# Patient Record
Sex: Female | Born: 1945 | Race: White | Hispanic: No | Marital: Married | Smoking: Never smoker
Health system: Southern US, Community
[De-identification: ages and names within clinical notes are randomized; demographics above are authoritative.]

## PROBLEM LIST (undated history)

## (undated) DIAGNOSIS — I509 Heart failure, unspecified: Secondary | ICD-10-CM

## (undated) DIAGNOSIS — E039 Hypothyroidism, unspecified: Secondary | ICD-10-CM

## (undated) DIAGNOSIS — I10 Essential (primary) hypertension: Secondary | ICD-10-CM

## (undated) DIAGNOSIS — M549 Dorsalgia, unspecified: Secondary | ICD-10-CM

---

## 2014-04-11 DIAGNOSIS — H44113 Panuveitis, bilateral: Secondary | ICD-10-CM | POA: Diagnosis not present

## 2014-05-03 DIAGNOSIS — H44112 Panuveitis, left eye: Secondary | ICD-10-CM | POA: Diagnosis not present

## 2014-05-24 DIAGNOSIS — Z1231 Encounter for screening mammogram for malignant neoplasm of breast: Secondary | ICD-10-CM | POA: Diagnosis not present

## 2014-06-04 DIAGNOSIS — H44113 Panuveitis, bilateral: Secondary | ICD-10-CM | POA: Diagnosis not present

## 2014-07-04 DIAGNOSIS — E782 Mixed hyperlipidemia: Secondary | ICD-10-CM | POA: Diagnosis not present

## 2014-07-04 DIAGNOSIS — I10 Essential (primary) hypertension: Secondary | ICD-10-CM | POA: Diagnosis not present

## 2014-07-04 DIAGNOSIS — E039 Hypothyroidism, unspecified: Secondary | ICD-10-CM | POA: Diagnosis not present

## 2014-07-04 DIAGNOSIS — E1165 Type 2 diabetes mellitus with hyperglycemia: Secondary | ICD-10-CM | POA: Diagnosis not present

## 2014-07-04 DIAGNOSIS — Z23 Encounter for immunization: Secondary | ICD-10-CM | POA: Diagnosis not present

## 2014-07-04 DIAGNOSIS — Z79899 Other long term (current) drug therapy: Secondary | ICD-10-CM | POA: Diagnosis not present

## 2014-07-04 DIAGNOSIS — D509 Iron deficiency anemia, unspecified: Secondary | ICD-10-CM | POA: Diagnosis not present

## 2014-07-15 DIAGNOSIS — D510 Vitamin B12 deficiency anemia due to intrinsic factor deficiency: Secondary | ICD-10-CM | POA: Diagnosis not present

## 2014-08-09 DIAGNOSIS — H35353 Cystoid macular degeneration, bilateral: Secondary | ICD-10-CM | POA: Diagnosis not present

## 2014-08-09 DIAGNOSIS — E119 Type 2 diabetes mellitus without complications: Secondary | ICD-10-CM | POA: Diagnosis not present

## 2014-08-09 DIAGNOSIS — H30893 Other chorioretinal inflammations, bilateral: Secondary | ICD-10-CM | POA: Diagnosis not present

## 2014-08-16 DIAGNOSIS — D510 Vitamin B12 deficiency anemia due to intrinsic factor deficiency: Secondary | ICD-10-CM | POA: Diagnosis not present

## 2014-09-04 DIAGNOSIS — H30893 Other chorioretinal inflammations, bilateral: Secondary | ICD-10-CM | POA: Diagnosis not present

## 2014-10-02 DIAGNOSIS — E119 Type 2 diabetes mellitus without complications: Secondary | ICD-10-CM | POA: Diagnosis not present

## 2014-10-02 DIAGNOSIS — H30893 Other chorioretinal inflammations, bilateral: Secondary | ICD-10-CM | POA: Diagnosis not present

## 2014-10-04 DIAGNOSIS — H44113 Panuveitis, bilateral: Secondary | ICD-10-CM | POA: Diagnosis not present

## 2014-10-04 DIAGNOSIS — H30893 Other chorioretinal inflammations, bilateral: Secondary | ICD-10-CM | POA: Diagnosis not present

## 2014-10-09 DIAGNOSIS — E538 Deficiency of other specified B group vitamins: Secondary | ICD-10-CM | POA: Diagnosis not present

## 2014-10-22 DIAGNOSIS — H44113 Panuveitis, bilateral: Secondary | ICD-10-CM | POA: Diagnosis not present

## 2014-10-22 DIAGNOSIS — E119 Type 2 diabetes mellitus without complications: Secondary | ICD-10-CM | POA: Diagnosis not present

## 2014-10-22 DIAGNOSIS — H30893 Other chorioretinal inflammations, bilateral: Secondary | ICD-10-CM | POA: Diagnosis not present

## 2014-11-12 DIAGNOSIS — F339 Major depressive disorder, recurrent, unspecified: Secondary | ICD-10-CM | POA: Diagnosis not present

## 2014-11-12 DIAGNOSIS — E1165 Type 2 diabetes mellitus with hyperglycemia: Secondary | ICD-10-CM | POA: Diagnosis not present

## 2014-11-12 DIAGNOSIS — E782 Mixed hyperlipidemia: Secondary | ICD-10-CM | POA: Diagnosis not present

## 2014-11-12 DIAGNOSIS — Z0000 Encounter for general adult medical examination without abnormal findings: Secondary | ICD-10-CM | POA: Diagnosis not present

## 2014-11-12 DIAGNOSIS — M8580 Other specified disorders of bone density and structure, unspecified site: Secondary | ICD-10-CM | POA: Diagnosis not present

## 2014-11-12 DIAGNOSIS — E039 Hypothyroidism, unspecified: Secondary | ICD-10-CM | POA: Diagnosis not present

## 2014-11-12 DIAGNOSIS — Z79899 Other long term (current) drug therapy: Secondary | ICD-10-CM | POA: Diagnosis not present

## 2014-11-12 DIAGNOSIS — D510 Vitamin B12 deficiency anemia due to intrinsic factor deficiency: Secondary | ICD-10-CM | POA: Diagnosis not present

## 2015-03-11 DIAGNOSIS — H44113 Panuveitis, bilateral: Secondary | ICD-10-CM | POA: Diagnosis not present

## 2015-03-11 DIAGNOSIS — H30893 Other chorioretinal inflammations, bilateral: Secondary | ICD-10-CM | POA: Diagnosis not present

## 2015-03-11 DIAGNOSIS — H35371 Puckering of macula, right eye: Secondary | ICD-10-CM | POA: Diagnosis not present

## 2015-04-08 DIAGNOSIS — E118 Type 2 diabetes mellitus with unspecified complications: Secondary | ICD-10-CM | POA: Diagnosis not present

## 2015-04-08 DIAGNOSIS — H30033 Focal chorioretinal inflammation, peripheral, bilateral: Secondary | ICD-10-CM | POA: Diagnosis not present

## 2015-04-08 DIAGNOSIS — H35373 Puckering of macula, bilateral: Secondary | ICD-10-CM | POA: Diagnosis not present

## 2015-04-08 DIAGNOSIS — Z961 Presence of intraocular lens: Secondary | ICD-10-CM | POA: Diagnosis not present

## 2015-04-09 DIAGNOSIS — H30033 Focal chorioretinal inflammation, peripheral, bilateral: Secondary | ICD-10-CM | POA: Diagnosis not present

## 2015-04-09 DIAGNOSIS — H30039 Focal chorioretinal inflammation, peripheral, unspecified eye: Secondary | ICD-10-CM | POA: Diagnosis not present

## 2015-04-11 DIAGNOSIS — H30033 Focal chorioretinal inflammation, peripheral, bilateral: Secondary | ICD-10-CM | POA: Diagnosis not present

## 2015-04-29 DIAGNOSIS — Z961 Presence of intraocular lens: Secondary | ICD-10-CM | POA: Diagnosis not present

## 2015-04-29 DIAGNOSIS — H35373 Puckering of macula, bilateral: Secondary | ICD-10-CM | POA: Diagnosis not present

## 2015-04-29 DIAGNOSIS — H30033 Focal chorioretinal inflammation, peripheral, bilateral: Secondary | ICD-10-CM | POA: Diagnosis not present

## 2015-04-29 DIAGNOSIS — E118 Type 2 diabetes mellitus with unspecified complications: Secondary | ICD-10-CM | POA: Diagnosis not present

## 2015-06-05 DIAGNOSIS — Z1389 Encounter for screening for other disorder: Secondary | ICD-10-CM | POA: Diagnosis not present

## 2015-06-05 DIAGNOSIS — E782 Mixed hyperlipidemia: Secondary | ICD-10-CM | POA: Diagnosis not present

## 2015-06-05 DIAGNOSIS — E1165 Type 2 diabetes mellitus with hyperglycemia: Secondary | ICD-10-CM | POA: Diagnosis not present

## 2015-06-05 DIAGNOSIS — E039 Hypothyroidism, unspecified: Secondary | ICD-10-CM | POA: Diagnosis not present

## 2015-06-05 DIAGNOSIS — N183 Chronic kidney disease, stage 3 (moderate): Secondary | ICD-10-CM | POA: Diagnosis not present

## 2015-06-17 DIAGNOSIS — H30033 Focal chorioretinal inflammation, peripheral, bilateral: Secondary | ICD-10-CM | POA: Diagnosis not present

## 2015-06-17 DIAGNOSIS — E118 Type 2 diabetes mellitus with unspecified complications: Secondary | ICD-10-CM | POA: Diagnosis not present

## 2015-06-17 DIAGNOSIS — H35373 Puckering of macula, bilateral: Secondary | ICD-10-CM | POA: Diagnosis not present

## 2015-06-17 DIAGNOSIS — Z961 Presence of intraocular lens: Secondary | ICD-10-CM | POA: Diagnosis not present

## 2015-09-09 DIAGNOSIS — Z961 Presence of intraocular lens: Secondary | ICD-10-CM | POA: Diagnosis not present

## 2015-09-09 DIAGNOSIS — E118 Type 2 diabetes mellitus with unspecified complications: Secondary | ICD-10-CM | POA: Diagnosis not present

## 2015-09-09 DIAGNOSIS — H35373 Puckering of macula, bilateral: Secondary | ICD-10-CM | POA: Diagnosis not present

## 2015-09-09 DIAGNOSIS — H44113 Panuveitis, bilateral: Secondary | ICD-10-CM | POA: Diagnosis not present

## 2015-09-09 DIAGNOSIS — H30033 Focal chorioretinal inflammation, peripheral, bilateral: Secondary | ICD-10-CM | POA: Diagnosis not present

## 2015-10-01 DIAGNOSIS — Z7901 Long term (current) use of anticoagulants: Secondary | ICD-10-CM | POA: Diagnosis not present

## 2015-10-21 DIAGNOSIS — Z961 Presence of intraocular lens: Secondary | ICD-10-CM | POA: Diagnosis not present

## 2015-10-21 DIAGNOSIS — H35373 Puckering of macula, bilateral: Secondary | ICD-10-CM | POA: Diagnosis not present

## 2015-10-21 DIAGNOSIS — H44113 Panuveitis, bilateral: Secondary | ICD-10-CM | POA: Diagnosis not present

## 2015-10-21 DIAGNOSIS — E118 Type 2 diabetes mellitus with unspecified complications: Secondary | ICD-10-CM | POA: Diagnosis not present

## 2015-10-21 DIAGNOSIS — H30033 Focal chorioretinal inflammation, peripheral, bilateral: Secondary | ICD-10-CM | POA: Diagnosis not present

## 2015-12-23 DIAGNOSIS — H44113 Panuveitis, bilateral: Secondary | ICD-10-CM | POA: Diagnosis not present

## 2015-12-23 DIAGNOSIS — Z961 Presence of intraocular lens: Secondary | ICD-10-CM | POA: Diagnosis not present

## 2015-12-23 DIAGNOSIS — Z79899 Other long term (current) drug therapy: Secondary | ICD-10-CM | POA: Diagnosis not present

## 2015-12-23 DIAGNOSIS — E118 Type 2 diabetes mellitus with unspecified complications: Secondary | ICD-10-CM | POA: Diagnosis not present

## 2015-12-23 DIAGNOSIS — H35373 Puckering of macula, bilateral: Secondary | ICD-10-CM | POA: Diagnosis not present

## 2015-12-23 DIAGNOSIS — H30033 Focal chorioretinal inflammation, peripheral, bilateral: Secondary | ICD-10-CM | POA: Diagnosis not present

## 2016-01-15 DIAGNOSIS — Z0000 Encounter for general adult medical examination without abnormal findings: Secondary | ICD-10-CM | POA: Diagnosis not present

## 2016-01-15 DIAGNOSIS — D510 Vitamin B12 deficiency anemia due to intrinsic factor deficiency: Secondary | ICD-10-CM | POA: Diagnosis not present

## 2016-01-15 DIAGNOSIS — N183 Chronic kidney disease, stage 3 (moderate): Secondary | ICD-10-CM | POA: Diagnosis not present

## 2016-01-15 DIAGNOSIS — L0100 Impetigo, unspecified: Secondary | ICD-10-CM | POA: Diagnosis not present

## 2016-01-15 DIAGNOSIS — B372 Candidiasis of skin and nail: Secondary | ICD-10-CM | POA: Diagnosis not present

## 2016-02-01 DIAGNOSIS — K760 Fatty (change of) liver, not elsewhere classified: Secondary | ICD-10-CM | POA: Diagnosis not present

## 2016-02-01 DIAGNOSIS — R945 Abnormal results of liver function studies: Secondary | ICD-10-CM | POA: Diagnosis not present

## 2016-02-12 DIAGNOSIS — J029 Acute pharyngitis, unspecified: Secondary | ICD-10-CM | POA: Diagnosis not present

## 2016-02-12 DIAGNOSIS — L0100 Impetigo, unspecified: Secondary | ICD-10-CM | POA: Diagnosis not present

## 2016-02-18 DIAGNOSIS — D649 Anemia, unspecified: Secondary | ICD-10-CM | POA: Diagnosis not present

## 2016-02-18 DIAGNOSIS — E860 Dehydration: Secondary | ICD-10-CM | POA: Diagnosis not present

## 2016-02-18 DIAGNOSIS — D539 Nutritional anemia, unspecified: Secondary | ICD-10-CM | POA: Diagnosis not present

## 2016-02-18 DIAGNOSIS — K121 Other forms of stomatitis: Secondary | ICD-10-CM | POA: Diagnosis not present

## 2016-02-18 DIAGNOSIS — R7989 Other specified abnormal findings of blood chemistry: Secondary | ICD-10-CM | POA: Diagnosis not present

## 2016-02-18 DIAGNOSIS — K1230 Oral mucositis (ulcerative), unspecified: Secondary | ICD-10-CM | POA: Diagnosis not present

## 2016-02-18 DIAGNOSIS — M7989 Other specified soft tissue disorders: Secondary | ICD-10-CM | POA: Diagnosis not present

## 2016-02-18 DIAGNOSIS — R531 Weakness: Secondary | ICD-10-CM | POA: Diagnosis not present

## 2016-02-18 DIAGNOSIS — M542 Cervicalgia: Secondary | ICD-10-CM | POA: Diagnosis not present

## 2016-02-18 DIAGNOSIS — K760 Fatty (change of) liver, not elsewhere classified: Secondary | ICD-10-CM | POA: Diagnosis not present

## 2016-02-18 DIAGNOSIS — E785 Hyperlipidemia, unspecified: Secondary | ICD-10-CM | POA: Diagnosis not present

## 2016-02-18 DIAGNOSIS — E119 Type 2 diabetes mellitus without complications: Secondary | ICD-10-CM | POA: Diagnosis not present

## 2016-02-18 DIAGNOSIS — R0603 Acute respiratory distress: Secondary | ICD-10-CM | POA: Diagnosis not present

## 2016-02-18 DIAGNOSIS — E669 Obesity, unspecified: Secondary | ICD-10-CM | POA: Diagnosis not present

## 2016-02-18 DIAGNOSIS — F418 Other specified anxiety disorders: Secondary | ICD-10-CM | POA: Diagnosis not present

## 2016-02-18 DIAGNOSIS — D709 Neutropenia, unspecified: Secondary | ICD-10-CM | POA: Diagnosis not present

## 2016-02-18 DIAGNOSIS — K1232 Oral mucositis (ulcerative) due to other drugs: Secondary | ICD-10-CM | POA: Diagnosis not present

## 2016-02-18 DIAGNOSIS — D61811 Other drug-induced pancytopenia: Secondary | ICD-10-CM | POA: Diagnosis not present

## 2016-02-18 DIAGNOSIS — I10 Essential (primary) hypertension: Secondary | ICD-10-CM | POA: Diagnosis not present

## 2016-02-18 DIAGNOSIS — E039 Hypothyroidism, unspecified: Secondary | ICD-10-CM | POA: Diagnosis not present

## 2016-02-18 DIAGNOSIS — R945 Abnormal results of liver function studies: Secondary | ICD-10-CM | POA: Diagnosis not present

## 2016-02-21 DIAGNOSIS — R7989 Other specified abnormal findings of blood chemistry: Secondary | ICD-10-CM | POA: Diagnosis not present

## 2016-02-21 DIAGNOSIS — D649 Anemia, unspecified: Secondary | ICD-10-CM | POA: Diagnosis not present

## 2016-02-21 DIAGNOSIS — R945 Abnormal results of liver function studies: Secondary | ICD-10-CM | POA: Diagnosis not present

## 2016-02-21 DIAGNOSIS — K1230 Oral mucositis (ulcerative), unspecified: Secondary | ICD-10-CM

## 2016-02-21 DIAGNOSIS — D709 Neutropenia, unspecified: Secondary | ICD-10-CM | POA: Diagnosis not present

## 2016-02-24 DIAGNOSIS — D61818 Other pancytopenia: Secondary | ICD-10-CM

## 2016-02-24 DIAGNOSIS — E876 Hypokalemia: Secondary | ICD-10-CM

## 2016-02-25 DIAGNOSIS — R197 Diarrhea, unspecified: Secondary | ICD-10-CM

## 2016-02-25 DIAGNOSIS — D61818 Other pancytopenia: Secondary | ICD-10-CM

## 2016-02-25 DIAGNOSIS — R7989 Other specified abnormal findings of blood chemistry: Secondary | ICD-10-CM

## 2016-02-25 DIAGNOSIS — E785 Hyperlipidemia, unspecified: Secondary | ICD-10-CM | POA: Diagnosis not present

## 2016-02-25 DIAGNOSIS — E860 Dehydration: Secondary | ICD-10-CM

## 2016-02-25 DIAGNOSIS — E119 Type 2 diabetes mellitus without complications: Secondary | ICD-10-CM

## 2016-02-25 DIAGNOSIS — D61811 Other drug-induced pancytopenia: Secondary | ICD-10-CM | POA: Diagnosis not present

## 2016-02-25 DIAGNOSIS — F418 Other specified anxiety disorders: Secondary | ICD-10-CM | POA: Diagnosis not present

## 2016-02-25 DIAGNOSIS — K121 Other forms of stomatitis: Secondary | ICD-10-CM

## 2016-02-25 DIAGNOSIS — I10 Essential (primary) hypertension: Secondary | ICD-10-CM | POA: Diagnosis not present

## 2016-03-10 DIAGNOSIS — D649 Anemia, unspecified: Secondary | ICD-10-CM | POA: Diagnosis not present

## 2016-03-10 DIAGNOSIS — D72819 Decreased white blood cell count, unspecified: Secondary | ICD-10-CM | POA: Diagnosis not present

## 2016-03-10 DIAGNOSIS — D803 Selective deficiency of immunoglobulin G [IgG] subclasses: Secondary | ICD-10-CM | POA: Diagnosis not present

## 2016-03-16 DIAGNOSIS — M549 Dorsalgia, unspecified: Secondary | ICD-10-CM | POA: Diagnosis not present

## 2016-03-16 DIAGNOSIS — S32039A Unspecified fracture of third lumbar vertebra, initial encounter for closed fracture: Secondary | ICD-10-CM | POA: Diagnosis not present

## 2016-03-16 DIAGNOSIS — S32030A Wedge compression fracture of third lumbar vertebra, initial encounter for closed fracture: Secondary | ICD-10-CM | POA: Diagnosis not present

## 2016-03-17 DIAGNOSIS — R748 Abnormal levels of other serum enzymes: Secondary | ICD-10-CM | POA: Diagnosis not present

## 2016-03-17 DIAGNOSIS — K121 Other forms of stomatitis: Secondary | ICD-10-CM | POA: Diagnosis not present

## 2016-03-17 DIAGNOSIS — E1165 Type 2 diabetes mellitus with hyperglycemia: Secondary | ICD-10-CM | POA: Diagnosis not present

## 2016-03-17 DIAGNOSIS — N179 Acute kidney failure, unspecified: Secondary | ICD-10-CM | POA: Diagnosis not present

## 2016-03-17 DIAGNOSIS — D649 Anemia, unspecified: Secondary | ICD-10-CM | POA: Diagnosis not present

## 2016-03-17 DIAGNOSIS — S32030A Wedge compression fracture of third lumbar vertebra, initial encounter for closed fracture: Secondary | ICD-10-CM | POA: Diagnosis not present

## 2016-03-17 DIAGNOSIS — D709 Neutropenia, unspecified: Secondary | ICD-10-CM | POA: Diagnosis not present

## 2016-03-25 DIAGNOSIS — M47896 Other spondylosis, lumbar region: Secondary | ICD-10-CM | POA: Diagnosis not present

## 2016-03-25 DIAGNOSIS — M545 Low back pain: Secondary | ICD-10-CM | POA: Diagnosis not present

## 2016-03-25 DIAGNOSIS — M5136 Other intervertebral disc degeneration, lumbar region: Secondary | ICD-10-CM | POA: Diagnosis not present

## 2016-03-25 DIAGNOSIS — S32030A Wedge compression fracture of third lumbar vertebra, initial encounter for closed fracture: Secondary | ICD-10-CM | POA: Diagnosis not present

## 2016-03-25 DIAGNOSIS — W19XXXA Unspecified fall, initial encounter: Secondary | ICD-10-CM | POA: Diagnosis not present

## 2016-03-30 DIAGNOSIS — Z6839 Body mass index (BMI) 39.0-39.9, adult: Secondary | ICD-10-CM | POA: Diagnosis not present

## 2016-03-30 DIAGNOSIS — S32030A Wedge compression fracture of third lumbar vertebra, initial encounter for closed fracture: Secondary | ICD-10-CM | POA: Diagnosis not present

## 2016-03-30 DIAGNOSIS — M549 Dorsalgia, unspecified: Secondary | ICD-10-CM | POA: Diagnosis not present

## 2016-03-30 DIAGNOSIS — M47816 Spondylosis without myelopathy or radiculopathy, lumbar region: Secondary | ICD-10-CM | POA: Diagnosis not present

## 2016-04-05 DIAGNOSIS — S32030A Wedge compression fracture of third lumbar vertebra, initial encounter for closed fracture: Secondary | ICD-10-CM | POA: Diagnosis not present

## 2016-04-13 DIAGNOSIS — M549 Dorsalgia, unspecified: Secondary | ICD-10-CM | POA: Diagnosis not present

## 2016-04-27 DIAGNOSIS — Z79899 Other long term (current) drug therapy: Secondary | ICD-10-CM | POA: Diagnosis not present

## 2016-04-27 DIAGNOSIS — M5416 Radiculopathy, lumbar region: Secondary | ICD-10-CM | POA: Diagnosis not present

## 2016-04-27 DIAGNOSIS — I10 Essential (primary) hypertension: Secondary | ICD-10-CM | POA: Diagnosis not present

## 2016-04-27 DIAGNOSIS — E039 Hypothyroidism, unspecified: Secondary | ICD-10-CM | POA: Diagnosis not present

## 2016-04-27 DIAGNOSIS — F339 Major depressive disorder, recurrent, unspecified: Secondary | ICD-10-CM | POA: Diagnosis not present

## 2016-04-27 DIAGNOSIS — E782 Mixed hyperlipidemia: Secondary | ICD-10-CM | POA: Diagnosis not present

## 2016-04-27 DIAGNOSIS — E1165 Type 2 diabetes mellitus with hyperglycemia: Secondary | ICD-10-CM | POA: Diagnosis not present

## 2016-05-04 DIAGNOSIS — M5126 Other intervertebral disc displacement, lumbar region: Secondary | ICD-10-CM | POA: Diagnosis not present

## 2016-05-18 DIAGNOSIS — M5126 Other intervertebral disc displacement, lumbar region: Secondary | ICD-10-CM | POA: Diagnosis not present

## 2016-06-18 DIAGNOSIS — M5116 Intervertebral disc disorders with radiculopathy, lumbar region: Secondary | ICD-10-CM | POA: Diagnosis not present

## 2016-06-18 DIAGNOSIS — S32030S Wedge compression fracture of third lumbar vertebra, sequela: Secondary | ICD-10-CM | POA: Diagnosis not present

## 2016-06-18 DIAGNOSIS — M9953 Intervertebral disc stenosis of neural canal of lumbar region: Secondary | ICD-10-CM | POA: Diagnosis not present

## 2016-06-18 DIAGNOSIS — M4726 Other spondylosis with radiculopathy, lumbar region: Secondary | ICD-10-CM | POA: Diagnosis not present

## 2016-06-18 DIAGNOSIS — M545 Low back pain: Secondary | ICD-10-CM | POA: Diagnosis not present

## 2016-06-21 DIAGNOSIS — M7989 Other specified soft tissue disorders: Secondary | ICD-10-CM | POA: Diagnosis not present

## 2016-06-21 DIAGNOSIS — L03116 Cellulitis of left lower limb: Secondary | ICD-10-CM | POA: Diagnosis not present

## 2016-06-21 DIAGNOSIS — I129 Hypertensive chronic kidney disease with stage 1 through stage 4 chronic kidney disease, or unspecified chronic kidney disease: Secondary | ICD-10-CM | POA: Diagnosis not present

## 2016-06-21 DIAGNOSIS — E039 Hypothyroidism, unspecified: Secondary | ICD-10-CM | POA: Diagnosis not present

## 2016-06-21 DIAGNOSIS — N289 Disorder of kidney and ureter, unspecified: Secondary | ICD-10-CM | POA: Diagnosis not present

## 2016-06-21 DIAGNOSIS — R238 Other skin changes: Secondary | ICD-10-CM | POA: Diagnosis not present

## 2016-06-21 DIAGNOSIS — S99911A Unspecified injury of right ankle, initial encounter: Secondary | ICD-10-CM | POA: Diagnosis not present

## 2016-06-21 DIAGNOSIS — E1122 Type 2 diabetes mellitus with diabetic chronic kidney disease: Secondary | ICD-10-CM | POA: Diagnosis not present

## 2016-06-21 DIAGNOSIS — R778 Other specified abnormalities of plasma proteins: Secondary | ICD-10-CM | POA: Diagnosis not present

## 2016-06-21 DIAGNOSIS — N183 Chronic kidney disease, stage 3 (moderate): Secondary | ICD-10-CM | POA: Diagnosis not present

## 2016-06-21 DIAGNOSIS — M79662 Pain in left lower leg: Secondary | ICD-10-CM | POA: Diagnosis not present

## 2016-06-21 DIAGNOSIS — S8992XA Unspecified injury of left lower leg, initial encounter: Secondary | ICD-10-CM | POA: Diagnosis not present

## 2016-06-21 DIAGNOSIS — R404 Transient alteration of awareness: Secondary | ICD-10-CM | POA: Diagnosis not present

## 2016-06-21 DIAGNOSIS — F419 Anxiety disorder, unspecified: Secondary | ICD-10-CM | POA: Diagnosis not present

## 2016-06-21 DIAGNOSIS — S299XXA Unspecified injury of thorax, initial encounter: Secondary | ICD-10-CM | POA: Diagnosis not present

## 2016-06-21 DIAGNOSIS — S8991XA Unspecified injury of right lower leg, initial encounter: Secondary | ICD-10-CM | POA: Diagnosis not present

## 2016-06-21 DIAGNOSIS — M79661 Pain in right lower leg: Secondary | ICD-10-CM | POA: Diagnosis not present

## 2016-06-21 DIAGNOSIS — R531 Weakness: Secondary | ICD-10-CM | POA: Diagnosis not present

## 2016-06-21 DIAGNOSIS — S99912A Unspecified injury of left ankle, initial encounter: Secondary | ICD-10-CM | POA: Diagnosis not present

## 2016-06-22 DIAGNOSIS — R531 Weakness: Secondary | ICD-10-CM | POA: Diagnosis not present

## 2016-06-22 DIAGNOSIS — M352 Behcet's disease: Secondary | ICD-10-CM | POA: Diagnosis not present

## 2016-06-22 DIAGNOSIS — L0390 Cellulitis, unspecified: Secondary | ICD-10-CM | POA: Diagnosis not present

## 2016-06-22 DIAGNOSIS — E119 Type 2 diabetes mellitus without complications: Secondary | ICD-10-CM | POA: Diagnosis not present

## 2016-06-22 DIAGNOSIS — N289 Disorder of kidney and ureter, unspecified: Secondary | ICD-10-CM | POA: Diagnosis not present

## 2016-06-22 DIAGNOSIS — R238 Other skin changes: Secondary | ICD-10-CM | POA: Diagnosis not present

## 2016-06-22 DIAGNOSIS — N1330 Unspecified hydronephrosis: Secondary | ICD-10-CM | POA: Diagnosis not present

## 2016-06-22 DIAGNOSIS — I10 Essential (primary) hypertension: Secondary | ICD-10-CM | POA: Diagnosis not present

## 2016-06-22 DIAGNOSIS — F418 Other specified anxiety disorders: Secondary | ICD-10-CM | POA: Diagnosis not present

## 2016-06-23 DIAGNOSIS — I10 Essential (primary) hypertension: Secondary | ICD-10-CM | POA: Diagnosis not present

## 2016-06-23 DIAGNOSIS — E119 Type 2 diabetes mellitus without complications: Secondary | ICD-10-CM | POA: Diagnosis not present

## 2016-06-23 DIAGNOSIS — R238 Other skin changes: Secondary | ICD-10-CM | POA: Diagnosis not present

## 2016-06-23 DIAGNOSIS — F419 Anxiety disorder, unspecified: Secondary | ICD-10-CM | POA: Diagnosis not present

## 2016-06-23 DIAGNOSIS — M1990 Unspecified osteoarthritis, unspecified site: Secondary | ICD-10-CM | POA: Diagnosis not present

## 2016-06-23 DIAGNOSIS — E039 Hypothyroidism, unspecified: Secondary | ICD-10-CM | POA: Diagnosis not present

## 2016-06-23 DIAGNOSIS — R531 Weakness: Secondary | ICD-10-CM | POA: Diagnosis not present

## 2016-06-24 DIAGNOSIS — E119 Type 2 diabetes mellitus without complications: Secondary | ICD-10-CM | POA: Diagnosis not present

## 2016-06-24 DIAGNOSIS — E1122 Type 2 diabetes mellitus with diabetic chronic kidney disease: Secondary | ICD-10-CM | POA: Diagnosis not present

## 2016-06-24 DIAGNOSIS — M7989 Other specified soft tissue disorders: Secondary | ICD-10-CM | POA: Diagnosis not present

## 2016-06-24 DIAGNOSIS — R238 Other skin changes: Secondary | ICD-10-CM | POA: Diagnosis not present

## 2016-06-24 DIAGNOSIS — E039 Hypothyroidism, unspecified: Secondary | ICD-10-CM | POA: Diagnosis not present

## 2016-06-24 DIAGNOSIS — R531 Weakness: Secondary | ICD-10-CM | POA: Diagnosis not present

## 2016-06-24 DIAGNOSIS — X58XXXS Exposure to other specified factors, sequela: Secondary | ICD-10-CM | POA: Diagnosis not present

## 2016-06-24 DIAGNOSIS — R609 Edema, unspecified: Secondary | ICD-10-CM | POA: Diagnosis not present

## 2016-06-24 DIAGNOSIS — R296 Repeated falls: Secondary | ICD-10-CM | POA: Diagnosis not present

## 2016-06-24 DIAGNOSIS — I10 Essential (primary) hypertension: Secondary | ICD-10-CM | POA: Diagnosis not present

## 2016-06-24 DIAGNOSIS — S32030S Wedge compression fracture of third lumbar vertebra, sequela: Secondary | ICD-10-CM | POA: Diagnosis not present

## 2016-06-24 DIAGNOSIS — I129 Hypertensive chronic kidney disease with stage 1 through stage 4 chronic kidney disease, or unspecified chronic kidney disease: Secondary | ICD-10-CM | POA: Diagnosis not present

## 2016-06-24 DIAGNOSIS — M1990 Unspecified osteoarthritis, unspecified site: Secondary | ICD-10-CM | POA: Diagnosis not present

## 2016-06-24 DIAGNOSIS — M170 Bilateral primary osteoarthritis of knee: Secondary | ICD-10-CM | POA: Diagnosis not present

## 2016-06-24 DIAGNOSIS — R41841 Cognitive communication deficit: Secondary | ICD-10-CM | POA: Diagnosis not present

## 2016-06-24 DIAGNOSIS — M48061 Spinal stenosis, lumbar region without neurogenic claudication: Secondary | ICD-10-CM | POA: Diagnosis not present

## 2016-06-24 DIAGNOSIS — N183 Chronic kidney disease, stage 3 (moderate): Secondary | ICD-10-CM | POA: Diagnosis not present

## 2016-06-24 DIAGNOSIS — M545 Low back pain: Secondary | ICD-10-CM | POA: Diagnosis not present

## 2016-06-24 DIAGNOSIS — F419 Anxiety disorder, unspecified: Secondary | ICD-10-CM | POA: Diagnosis not present

## 2016-06-24 DIAGNOSIS — G8929 Other chronic pain: Secondary | ICD-10-CM | POA: Diagnosis not present

## 2016-07-02 DIAGNOSIS — X58XXXS Exposure to other specified factors, sequela: Secondary | ICD-10-CM | POA: Diagnosis not present

## 2016-07-02 DIAGNOSIS — M545 Low back pain: Secondary | ICD-10-CM | POA: Diagnosis not present

## 2016-07-02 DIAGNOSIS — S32030S Wedge compression fracture of third lumbar vertebra, sequela: Secondary | ICD-10-CM | POA: Diagnosis not present

## 2016-07-02 DIAGNOSIS — M48061 Spinal stenosis, lumbar region without neurogenic claudication: Secondary | ICD-10-CM | POA: Diagnosis not present

## 2016-07-05 ENCOUNTER — Encounter: Payer: Self-pay | Admitting: *Deleted

## 2016-07-05 ENCOUNTER — Other Ambulatory Visit: Payer: Self-pay | Admitting: *Deleted

## 2016-07-05 NOTE — Patient Outreach (Signed)
Spoke with Alden Server, SW at facility.  She reports patient discharged this weekend.  Patient had appealed the [facility] decision but lost appeal.  Lucy set up home care through [facility]. Feels patient could benefit from [facility] care management services.  Plan to reach out to patient and or family re: [facility] care management services.  Alben Spittle. Albertha Ghee, RN, BSN, CCM  Post Acute Chartered loss adjuster [phone]) Business Cell  [phone]) Toll Free Office

## 2016-07-05 NOTE — Patient Outreach (Signed)
Call to patient, verified HIPAA.  Patient reports that she is home from skilled, states she had 3 falls in one day and that sent her to hospital and to skilled. She had "spinal surgery" in the past and had f/u MRI on Friday.  Patient denies any heart failure or COPD, she does have diabetes which she manages with diet and oral medications.  Patient reports she has left knee and leg pain, uses hydrocodone and lyrica for management.  She has a wound on her left knee and some blisters.  Patient states she is married, her spouse assists her with meals and with transportation. She states spouse has already started supper for tonight. Patient reports she takes about 10 medications, only one is expensive, lyrica, she is unsure if she will continue this medication or not.  Patient verifies she has home care services.  Patient has appointment with Dr. Sherral Hammers, her PCP this Friday, spouse will transport.  RNCM educated patient to let MD assess her left leg/knee regarding pain, blisters and scabbing.   RNCM reviewed [facility] program services.  RNCM reviewed [facility] pharmacy program RNCM reviewed [employer] meals program for post hospital/Skilled discharge.  Patient would appreciate some information on [facility] program, did not want to pursue [employer] meals.   Plan to mail outreach letter with brochure and magnet. Will sign off at this time.   Alben Spittle. Albertha Ghee, RN, BSN, CCM  Post Acute Chartered loss adjuster [phone]) Business Cell  [phone]) Toll Free Office

## 2016-07-06 DIAGNOSIS — E1122 Type 2 diabetes mellitus with diabetic chronic kidney disease: Secondary | ICD-10-CM | POA: Diagnosis not present

## 2016-07-06 DIAGNOSIS — N183 Chronic kidney disease, stage 3 (moderate): Secondary | ICD-10-CM | POA: Diagnosis not present

## 2016-07-06 DIAGNOSIS — G8929 Other chronic pain: Secondary | ICD-10-CM | POA: Diagnosis not present

## 2016-07-06 DIAGNOSIS — I129 Hypertensive chronic kidney disease with stage 1 through stage 4 chronic kidney disease, or unspecified chronic kidney disease: Secondary | ICD-10-CM | POA: Diagnosis not present

## 2016-07-06 DIAGNOSIS — R238 Other skin changes: Secondary | ICD-10-CM | POA: Diagnosis not present

## 2016-07-06 DIAGNOSIS — M1991 Primary osteoarthritis, unspecified site: Secondary | ICD-10-CM | POA: Diagnosis not present

## 2016-07-07 DIAGNOSIS — N183 Chronic kidney disease, stage 3 (moderate): Secondary | ICD-10-CM | POA: Diagnosis not present

## 2016-07-07 DIAGNOSIS — R238 Other skin changes: Secondary | ICD-10-CM | POA: Diagnosis not present

## 2016-07-07 DIAGNOSIS — I129 Hypertensive chronic kidney disease with stage 1 through stage 4 chronic kidney disease, or unspecified chronic kidney disease: Secondary | ICD-10-CM | POA: Diagnosis not present

## 2016-07-07 DIAGNOSIS — E1122 Type 2 diabetes mellitus with diabetic chronic kidney disease: Secondary | ICD-10-CM | POA: Diagnosis not present

## 2016-07-07 DIAGNOSIS — G8929 Other chronic pain: Secondary | ICD-10-CM | POA: Diagnosis not present

## 2016-07-07 DIAGNOSIS — M1991 Primary osteoarthritis, unspecified site: Secondary | ICD-10-CM | POA: Diagnosis not present

## 2016-07-08 DIAGNOSIS — R238 Other skin changes: Secondary | ICD-10-CM | POA: Diagnosis not present

## 2016-07-08 DIAGNOSIS — M4807 Spinal stenosis, lumbosacral region: Secondary | ICD-10-CM | POA: Diagnosis not present

## 2016-07-08 DIAGNOSIS — Z1389 Encounter for screening for other disorder: Secondary | ICD-10-CM | POA: Diagnosis not present

## 2016-07-08 DIAGNOSIS — R262 Difficulty in walking, not elsewhere classified: Secondary | ICD-10-CM | POA: Diagnosis not present

## 2016-07-09 DIAGNOSIS — M4726 Other spondylosis with radiculopathy, lumbar region: Secondary | ICD-10-CM | POA: Diagnosis not present

## 2016-07-09 DIAGNOSIS — M5116 Intervertebral disc disorders with radiculopathy, lumbar region: Secondary | ICD-10-CM | POA: Diagnosis not present

## 2016-07-09 DIAGNOSIS — S32030S Wedge compression fracture of third lumbar vertebra, sequela: Secondary | ICD-10-CM | POA: Diagnosis not present

## 2016-07-09 DIAGNOSIS — M8008XA Age-related osteoporosis with current pathological fracture, vertebra(e), initial encounter for fracture: Secondary | ICD-10-CM | POA: Diagnosis not present

## 2016-07-12 DIAGNOSIS — M1991 Primary osteoarthritis, unspecified site: Secondary | ICD-10-CM | POA: Diagnosis not present

## 2016-07-12 DIAGNOSIS — N183 Chronic kidney disease, stage 3 (moderate): Secondary | ICD-10-CM | POA: Diagnosis not present

## 2016-07-12 DIAGNOSIS — R238 Other skin changes: Secondary | ICD-10-CM | POA: Diagnosis not present

## 2016-07-12 DIAGNOSIS — G8929 Other chronic pain: Secondary | ICD-10-CM | POA: Diagnosis not present

## 2016-07-12 DIAGNOSIS — I129 Hypertensive chronic kidney disease with stage 1 through stage 4 chronic kidney disease, or unspecified chronic kidney disease: Secondary | ICD-10-CM | POA: Diagnosis not present

## 2016-07-12 DIAGNOSIS — E1122 Type 2 diabetes mellitus with diabetic chronic kidney disease: Secondary | ICD-10-CM | POA: Diagnosis not present

## 2016-07-13 DIAGNOSIS — M1991 Primary osteoarthritis, unspecified site: Secondary | ICD-10-CM | POA: Diagnosis not present

## 2016-07-13 DIAGNOSIS — I129 Hypertensive chronic kidney disease with stage 1 through stage 4 chronic kidney disease, or unspecified chronic kidney disease: Secondary | ICD-10-CM | POA: Diagnosis not present

## 2016-07-13 DIAGNOSIS — N183 Chronic kidney disease, stage 3 (moderate): Secondary | ICD-10-CM | POA: Diagnosis not present

## 2016-07-13 DIAGNOSIS — R238 Other skin changes: Secondary | ICD-10-CM | POA: Diagnosis not present

## 2016-07-13 DIAGNOSIS — E1122 Type 2 diabetes mellitus with diabetic chronic kidney disease: Secondary | ICD-10-CM | POA: Diagnosis not present

## 2016-07-13 DIAGNOSIS — G8929 Other chronic pain: Secondary | ICD-10-CM | POA: Diagnosis not present

## 2016-07-14 ENCOUNTER — Inpatient Hospital Stay (HOSPITAL_COMMUNITY)
Admission: EM | Admit: 2016-07-14 | Discharge: 2016-07-16 | DRG: 872 | Disposition: A | Discharge status: Home / Self Care | Payer: Medicare HMO | Attending: Internal Medicine | Admitting: Internal Medicine

## 2016-07-14 ENCOUNTER — Emergency Department (HOSPITAL_COMMUNITY): Payer: Medicare HMO

## 2016-07-14 ENCOUNTER — Encounter (HOSPITAL_COMMUNITY): Payer: Self-pay | Admitting: Emergency Medicine

## 2016-07-14 DIAGNOSIS — M79604 Pain in right leg: Secondary | ICD-10-CM

## 2016-07-14 DIAGNOSIS — D696 Thrombocytopenia, unspecified: Secondary | ICD-10-CM | POA: Diagnosis not present

## 2016-07-14 DIAGNOSIS — S79912A Unspecified injury of left hip, initial encounter: Secondary | ICD-10-CM | POA: Diagnosis not present

## 2016-07-14 DIAGNOSIS — E785 Hyperlipidemia, unspecified: Secondary | ICD-10-CM | POA: Diagnosis present

## 2016-07-14 DIAGNOSIS — E039 Hypothyroidism, unspecified: Secondary | ICD-10-CM | POA: Diagnosis present

## 2016-07-14 DIAGNOSIS — E1122 Type 2 diabetes mellitus with diabetic chronic kidney disease: Secondary | ICD-10-CM | POA: Diagnosis not present

## 2016-07-14 DIAGNOSIS — M79605 Pain in left leg: Secondary | ICD-10-CM | POA: Diagnosis not present

## 2016-07-14 DIAGNOSIS — R4589 Other symptoms and signs involving emotional state: Secondary | ICD-10-CM | POA: Diagnosis present

## 2016-07-14 DIAGNOSIS — Z66 Do not resuscitate: Secondary | ICD-10-CM | POA: Diagnosis present

## 2016-07-14 DIAGNOSIS — L0390 Cellulitis, unspecified: Secondary | ICD-10-CM | POA: Diagnosis not present

## 2016-07-14 DIAGNOSIS — N183 Chronic kidney disease, stage 3 unspecified: Secondary | ICD-10-CM | POA: Diagnosis present

## 2016-07-14 DIAGNOSIS — I129 Hypertensive chronic kidney disease with stage 1 through stage 4 chronic kidney disease, or unspecified chronic kidney disease: Secondary | ICD-10-CM | POA: Diagnosis not present

## 2016-07-14 DIAGNOSIS — Y92009 Unspecified place in unspecified non-institutional (private) residence as the place of occurrence of the external cause: Secondary | ICD-10-CM | POA: Diagnosis not present

## 2016-07-14 DIAGNOSIS — A419 Sepsis, unspecified organism: Principal | ICD-10-CM | POA: Diagnosis present

## 2016-07-14 DIAGNOSIS — L03115 Cellulitis of right lower limb: Secondary | ICD-10-CM | POA: Diagnosis present

## 2016-07-14 DIAGNOSIS — E1140 Type 2 diabetes mellitus with diabetic neuropathy, unspecified: Secondary | ICD-10-CM | POA: Diagnosis not present

## 2016-07-14 DIAGNOSIS — F329 Major depressive disorder, single episode, unspecified: Secondary | ICD-10-CM | POA: Diagnosis present

## 2016-07-14 DIAGNOSIS — R296 Repeated falls: Secondary | ICD-10-CM | POA: Diagnosis present

## 2016-07-14 DIAGNOSIS — D61818 Other pancytopenia: Secondary | ICD-10-CM | POA: Diagnosis not present

## 2016-07-14 DIAGNOSIS — D649 Anemia, unspecified: Secondary | ICD-10-CM | POA: Diagnosis not present

## 2016-07-14 DIAGNOSIS — T148XXA Other injury of unspecified body region, initial encounter: Secondary | ICD-10-CM | POA: Diagnosis not present

## 2016-07-14 DIAGNOSIS — W19XXXA Unspecified fall, initial encounter: Secondary | ICD-10-CM | POA: Diagnosis present

## 2016-07-14 DIAGNOSIS — R1032 Left lower quadrant pain: Secondary | ICD-10-CM | POA: Diagnosis not present

## 2016-07-14 DIAGNOSIS — E1165 Type 2 diabetes mellitus with hyperglycemia: Secondary | ICD-10-CM | POA: Diagnosis present

## 2016-07-14 DIAGNOSIS — M1991 Primary osteoarthritis, unspecified site: Secondary | ICD-10-CM | POA: Diagnosis not present

## 2016-07-14 DIAGNOSIS — L03116 Cellulitis of left lower limb: Secondary | ICD-10-CM | POA: Diagnosis not present

## 2016-07-14 DIAGNOSIS — N179 Acute kidney failure, unspecified: Secondary | ICD-10-CM | POA: Diagnosis present

## 2016-07-14 DIAGNOSIS — I509 Heart failure, unspecified: Secondary | ICD-10-CM | POA: Diagnosis present

## 2016-07-14 DIAGNOSIS — G8929 Other chronic pain: Secondary | ICD-10-CM | POA: Diagnosis not present

## 2016-07-14 DIAGNOSIS — R739 Hyperglycemia, unspecified: Secondary | ICD-10-CM

## 2016-07-14 DIAGNOSIS — I130 Hypertensive heart and chronic kidney disease with heart failure and stage 1 through stage 4 chronic kidney disease, or unspecified chronic kidney disease: Secondary | ICD-10-CM | POA: Diagnosis not present

## 2016-07-14 DIAGNOSIS — Z7901 Long term (current) use of anticoagulants: Secondary | ICD-10-CM | POA: Diagnosis not present

## 2016-07-14 DIAGNOSIS — R269 Unspecified abnormalities of gait and mobility: Secondary | ICD-10-CM | POA: Diagnosis not present

## 2016-07-14 DIAGNOSIS — M5489 Other dorsalgia: Secondary | ICD-10-CM | POA: Diagnosis not present

## 2016-07-14 DIAGNOSIS — R238 Other skin changes: Secondary | ICD-10-CM | POA: Diagnosis not present

## 2016-07-14 DIAGNOSIS — I959 Hypotension, unspecified: Secondary | ICD-10-CM | POA: Diagnosis present

## 2016-07-14 HISTORY — DX: Hypothyroidism, unspecified: E03.9

## 2016-07-14 HISTORY — DX: Essential (primary) hypertension: I10

## 2016-07-14 HISTORY — DX: Heart failure, unspecified: I50.9

## 2016-07-14 HISTORY — DX: Dorsalgia, unspecified: M54.9

## 2016-07-14 LAB — COMPREHENSIVE METABOLIC PANEL
ALT: 14 U/L (ref 14–54)
AST: 19 U/L (ref 15–41)
Albumin: 3.2 g/dL — ABNORMAL LOW (ref 3.5–5.0)
Alkaline Phosphatase: 64 U/L (ref 38–126)
Anion gap: 7 (ref 5–15)
BUN: 15 mg/dL (ref 6–20)
CHLORIDE: 99 mmol/L — AB (ref 101–111)
CO2: 29 mmol/L (ref 22–32)
CREATININE: 1.22 mg/dL — AB (ref 0.44–1.00)
Calcium: 9.3 mg/dL (ref 8.9–10.3)
GFR calc non Af Amer: 44 mL/min — ABNORMAL LOW (ref >60)
GFR, EST AFRICAN AMERICAN: 51 mL/min — AB (ref >60)
Glucose, Bld: 111 mg/dL — ABNORMAL HIGH (ref 65–99)
POTASSIUM: 3.8 mmol/L (ref 3.5–5.1)
SODIUM: 135 mmol/L (ref 135–145)
Total Bilirubin: 0.6 mg/dL (ref 0.3–1.2)
Total Protein: 6.6 g/dL (ref 6.5–8.1)

## 2016-07-14 LAB — URINALYSIS, ROUTINE W REFLEX MICROSCOPIC
Bilirubin Urine: NEGATIVE
Glucose, UA: NEGATIVE mg/dL
Hgb urine dipstick: NEGATIVE
Ketones, ur: NEGATIVE mg/dL
Leukocytes, UA: NEGATIVE
NITRITE: NEGATIVE
PROTEIN: NEGATIVE mg/dL
SPECIFIC GRAVITY, URINE: 1.008 (ref 1.005–1.030)
pH: 6 (ref 5.0–8.0)

## 2016-07-14 LAB — CBC
HCT: 30.2 % — ABNORMAL LOW (ref 36.0–46.0)
HEMOGLOBIN: 9.7 g/dL — AB (ref 12.0–15.0)
MCH: 30.3 pg (ref 26.0–34.0)
MCHC: 32.1 g/dL (ref 30.0–36.0)
MCV: 94.4 fL (ref 78.0–100.0)
Platelets: 145 10*3/uL — ABNORMAL LOW (ref 150–400)
RBC: 3.2 MIL/uL — AB (ref 3.87–5.11)
RDW: 12.2 % (ref 11.5–15.5)
WBC: 5.4 10*3/uL (ref 4.0–10.5)

## 2016-07-14 LAB — I-STAT CG4 LACTIC ACID, ED
LACTIC ACID, VENOUS: 0.96 mmol/L (ref 0.5–1.9)
Lactic Acid, Venous: 2.13 mmol/L (ref 0.5–1.9)

## 2016-07-14 MED ORDER — PIPERACILLIN-TAZOBACTAM 3.375 G IVPB
3.3750 g | Freq: Three times a day (TID) | INTRAVENOUS | Status: DC
  Administered 2016-07-15 – 2016-07-16 (×5): 3.375 g via INTRAVENOUS
  Filled 2016-07-14 (×6): qty 50

## 2016-07-14 MED ORDER — PREGABALIN 75 MG PO CAPS
75.0000 mg | ORAL_CAPSULE | Freq: Every day | ORAL | Status: DC
  Administered 2016-07-15 – 2016-07-16 (×2): 75 mg via ORAL
  Filled 2016-07-14 (×2): qty 1

## 2016-07-14 MED ORDER — VANCOMYCIN HCL 10 G IV SOLR
2000.0000 mg | Freq: Once | INTRAVENOUS | Status: AC
  Administered 2016-07-14: 2000 mg via INTRAVENOUS
  Filled 2016-07-14: qty 2000

## 2016-07-14 MED ORDER — VANCOMYCIN HCL IN DEXTROSE 1-5 GM/200ML-% IV SOLN: 1000.0000 mg | Freq: Once | INTRAVENOUS | Status: DC

## 2016-07-14 MED ORDER — SODIUM CHLORIDE 0.9 % IV BOLUS (SEPSIS)
500.0000 mL | Freq: Once | INTRAVENOUS | Status: AC
  Administered 2016-07-14: 500 mL via INTRAVENOUS

## 2016-07-14 MED ORDER — ACETAMINOPHEN 325 MG PO TABS
650.0000 mg | ORAL_TABLET | Freq: Four times a day (QID) | ORAL | Status: DC | PRN
  Administered 2016-07-16: 650 mg via ORAL
  Filled 2016-07-14 (×2): qty 2

## 2016-07-14 MED ORDER — INSULIN ASPART 100 UNIT/ML ~~LOC~~ SOLN
0.0000 [IU] | Freq: Three times a day (TID) | SUBCUTANEOUS | Status: DC
  Administered 2016-07-15 – 2016-07-16 (×3): 2 [IU] via SUBCUTANEOUS

## 2016-07-14 MED ORDER — ACETAMINOPHEN 650 MG RE SUPP: 650.0000 mg | Freq: Four times a day (QID) | RECTAL | Status: DC | PRN

## 2016-07-14 MED ORDER — FLUOXETINE HCL 20 MG PO CAPS
60.0000 mg | ORAL_CAPSULE | Freq: Every day | ORAL | Status: DC
  Administered 2016-07-15 – 2016-07-16 (×2): 60 mg via ORAL
  Filled 2016-07-14 (×2): qty 3

## 2016-07-14 MED ORDER — ENOXAPARIN SODIUM 40 MG/0.4ML ~~LOC~~ SOLN
40.0000 mg | Freq: Every day | SUBCUTANEOUS | Status: DC
  Administered 2016-07-15: 40 mg via SUBCUTANEOUS
  Filled 2016-07-14 (×2): qty 0.4

## 2016-07-14 MED ORDER — HYDROCODONE-ACETAMINOPHEN 10-325 MG PO TABS
1.0000 | ORAL_TABLET | Freq: Four times a day (QID) | ORAL | Status: DC | PRN
  Administered 2016-07-15 – 2016-07-16 (×6): 1 via ORAL
  Filled 2016-07-14 (×6): qty 1

## 2016-07-14 MED ORDER — ONDANSETRON HCL 4 MG PO TABS: 4.0000 mg | ORAL_TABLET | Freq: Four times a day (QID) | ORAL | Status: DC | PRN

## 2016-07-14 MED ORDER — FLUOXETINE HCL 40 MG PO CAPS: 40.0000 mg | ORAL_CAPSULE | Freq: Every day | ORAL | Status: DC

## 2016-07-14 MED ORDER — SODIUM CHLORIDE 0.9 % IV BOLUS (SEPSIS): 1000.0000 mL | Freq: Once | INTRAVENOUS | Status: DC

## 2016-07-14 MED ORDER — ONDANSETRON HCL 4 MG/2ML IJ SOLN: 4.0000 mg | Freq: Four times a day (QID) | INTRAMUSCULAR | Status: DC | PRN

## 2016-07-14 MED ORDER — SIMVASTATIN 20 MG PO TABS
20.0000 mg | ORAL_TABLET | Freq: Every evening | ORAL | Status: DC
  Administered 2016-07-15: 20 mg via ORAL
  Filled 2016-07-14: qty 1

## 2016-07-14 MED ORDER — SODIUM CHLORIDE 0.9 % IV SOLN
INTRAVENOUS | Status: DC
  Administered 2016-07-15: via INTRAVENOUS

## 2016-07-14 MED ORDER — DOCUSATE SODIUM 100 MG PO CAPS
100.0000 mg | ORAL_CAPSULE | Freq: Two times a day (BID) | ORAL | Status: DC
  Administered 2016-07-15 (×2): 100 mg via ORAL
  Filled 2016-07-14 (×4): qty 1

## 2016-07-14 MED ORDER — PIPERACILLIN-TAZOBACTAM 3.375 G IVPB 30 MIN
3.3750 g | Freq: Once | INTRAVENOUS | Status: AC
  Administered 2016-07-14: 3.375 g via INTRAVENOUS
  Filled 2016-07-14: qty 50

## 2016-07-14 MED ORDER — VANCOMYCIN HCL 10 G IV SOLR
1250.0000 mg | INTRAVENOUS | Status: DC
  Administered 2016-07-15: 1250 mg via INTRAVENOUS
  Filled 2016-07-14 (×2): qty 1250

## 2016-07-14 MED ORDER — SODIUM CHLORIDE 0.9 % IV BOLUS (SEPSIS)
30.0000 mL/kg | Freq: Once | INTRAVENOUS | Status: AC
  Administered 2016-07-14: 1710 mL via INTRAVENOUS

## 2016-07-14 MED ORDER — LEVOTHYROXINE SODIUM 75 MCG PO TABS
75.0000 ug | ORAL_TABLET | Freq: Every day | ORAL | Status: DC
  Administered 2016-07-15 – 2016-07-16 (×2): 75 ug via ORAL
  Filled 2016-07-14 (×2): qty 1

## 2016-07-14 NOTE — ED Provider Notes (Signed)
[facility]-EMERGENCY DEPT Provider Note   CSN: [id number] Arrival date & time: 07/14/16  1438     History   Chief Complaint Chief Complaint  Patient presents with  . Cellulitis  . social issue  . Fall    HPI Crystal King is a 71 y.o. female.  HPI Patient presents to the emergency department with complaints of increasing generalized weakness.  She is sent to the emergency department from home where her home health team thought she was becoming increasingly weak and more difficult to care for at home.  She presents to the emergency department the blood pressure in the 80s and obvious cellulitis of her lower extremities.  Patient reports increasing pain in her bilateral lower extremities.  Denies chest pain shortness of breath.  No abdominal pain.  Denies nausea vomiting and diarrhea.  She reports increasing falls secondary to generalized weakness.  No recent head injury.   Past Medical History:  Diagnosis Date  . Back pain   . CHF (congestive heart failure) (HCC)     There are no active problems to display for this patient.   No past surgical history on file.  OB History    No data available       Home Medications    Prior to Admission medications   Not on File    Family History No family history on file.  Social History Social History  Substance Use Topics  . Smoking status: Not on file  . Smokeless tobacco: Not on file  . Alcohol use Not on file     Allergies   Patient has no allergy information on record.   Review of Systems Review of Systems  All other systems reviewed and are negative.    Physical Exam Updated Vital Signs BP (!) 93/51   Pulse 66   Temp 98.5 F (36.9 C) (Rectal)   Resp 17   Ht  (1.651 m)   Wt 230 lb (104.3 kg)   SpO2 99%   BMI 38.27 kg/m   Physical Exam  Constitutional: She is oriented to person, place, and time. She appears well-developed and well-nourished. No distress.  HENT:  Head: Normocephalic and atraumatic.    Eyes: EOM are normal.  Neck: Normal range of motion.  Cardiovascular: Normal rate, regular rhythm and normal heart sounds.   Pulmonary/Chest: Effort normal and breath sounds normal.  Abdominal: Soft. She exhibits no distension. There is no tenderness.  Musculoskeletal: Normal range of motion.  Neurological: She is alert and oriented to person, place, and time.  Skin: Skin is warm and dry.  Psychiatric: She has a normal mood and affect. Judgment normal.  Nursing note and vitals reviewed.    ED Treatments / Results  Labs (all labs ordered are listed, but only abnormal results are displayed) Labs Reviewed  CBC - Abnormal; Notable for the following:       Result Value   RBC 3.20 (*)    Hemoglobin 9.7 (*)    HCT 30.2 (*)    Platelets 145 (*)    All other components within normal limits  CULTURE, BLOOD (ROUTINE X 2)  CULTURE, BLOOD (ROUTINE X 2)  URINALYSIS, ROUTINE W REFLEX MICROSCOPIC  COMPREHENSIVE METABOLIC PANEL  I-STAT CG4 LACTIC ACID, ED     EKG  EKG Interpretation  Date/Time:  Wednesday Jul 14 2016 16:14:58 EDT Ventricular Rate:  66 PR Interval:    QRS Duration: 94 QT Interval:  418 QTC Calculation: 438 R Axis:   -34 Text  Interpretation:  Sinus rhythm Short PR interval Left axis deviation Low voltage, precordial leads Consider anterior infarct No old tracing to compare Confirmed by Raidyn Wassink  MD, Caryn Bee ([id number]) on 07/14/2016 4:44:53 PM       Radiology No results found.  Procedures Procedures (including critical care time)  Medications Ordered in ED Medications  piperacillin-tazobactam (ZOSYN) IVPB 3.375 g (3.375 g Intravenous New Bag/Given 07/14/16 1644)  vancomycin (VANCOCIN) 2,000 mg in sodium chloride 0.9 % 500 mL IVPB (2,000 mg Intravenous New Bag/Given 07/14/16 1644)  sodium chloride 0.9 % bolus 500 mL (not administered)  sodium chloride 0.9 % bolus 1,710 mL (1,710 mLs Intravenous New Bag/Given 07/14/16 1641)     Initial Impression / Assessment and Plan / ED  Course  I have reviewed the triage vital signs and the nursing notes.  Pertinent labs & imaging results that were available during my care of the patient were reviewed by me and considered in my medical decision making (see chart for details).    Patient presents with what appears to be sepsis.  Fluid boluses given.  Start on vancomycin and Zosyn.  Obvious cellulitis of her bilateral lower extremity as.  Patient be admitted the hospital.  Blood pressure improving with fluids    Final Clinical Impressions(s) / ED Diagnoses   Final diagnoses:  None    New Prescriptions New Prescriptions   No medications on file     Azalia Bilis, MD 07/14/16 1713

## 2016-07-14 NOTE — ED Triage Notes (Signed)
To ED via [facility] from home--pt fell today and could not get up-- lives with husband who is unable to assist pt. 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 Pt is alert/oriented x 4, does stand to pivot at home.  Husband is unable to come to the hospital due to no transportation-- pt was in SNF for 7 days but was sent home when insurance ran out- per EMS.

## 2016-07-14 NOTE — H&P (Signed)
History and Physical    Crystal King ZOX:[id number] DOB: 1946/01/17 DOA: 07/14/2016  PCP: Karen Kays, MD Consultants:  Adin Hector (?) - neurosurgery Patient coming from: home - lives with husband; NOK: husband, [phone]  Chief Complaint: left leg cellulitis  HPI: Crystal King is a 71 y.o. female with medical history significant of HTN, hypothyroidism, CHF, and back pain (records unavailable) presenting with leg left pain.  When asked what was bothering her, however, she told a long and complicated story about how she has been "falling right much here lately."  Late November, very sick.  Went to PCP after she got up from the chair and "fell flat on my fanny."  Blood work, sent her to [facility] and she was hospitalized for 6 days.    Lots of trouble with her eyes.  He started her on methotrexate, she had a reaction to that - "I was so sick I thought I was going to die."  She recovered but continued to fall.  Went again to [place] [facility] and then she was sent to [facility] in [place] for rehab and stayed about 2 weeks.  Got home and started falling again.  She fell again yesterday and the paramedics figured it was best if she came to the hospital.  Appt with spine surgeon on 4/20 and unable to make the appt, but she has bulging discs and needs back surgery; she went last Friday and she is supposed to have surgery on 5/16.  She hurts all over, but left leg is bothering her.  No fevers.     ED Course: Appears to have sepsis, bolus given, Vanc/Zosyn.  Cellulitis as source.  Review of Systems: As per HPI; otherwise review of systems reviewed and negative.     Past Medical History:  Diagnosis Date  . Back pain   . CHF (congestive heart failure) (HCC)   . Essential hypertension   . Hypothyroidism     History reviewed. No pertinent surgical history.  Social History   Social History  . Marital status: Married    Spouse name: N/A  . Number of children: N/A  . Years of education: N/A     Occupational History  . retired    Social History Main Topics  . Smoking status: Never Smoker  . Smokeless tobacco: Never Used  . Alcohol use No  . Drug use: No  . Sexual activity: Not on file   Other Topics Concern  . Not on file   Social History Narrative  . No narrative on file    No Known Allergies  History reviewed. No pertinent family history.  Prior to Admission medications   Medication Sig Start Date End Date Taking? Authorizing Provider  Cholecalciferol (VITAMIN D PO) Take 1 tablet by mouth 2 (two) times a week. Mon / Fri   Yes Historical Provider, MD  FLUoxetine (PROZAC) 20 MG capsule Take 20 mg by mouth daily. Take wit 40 mg for total = 60 mg 07/04/16  Yes Historical Provider, MD  FLUoxetine (PROZAC) 40 MG capsule Take 40 mg by mouth daily. Take w/ 20 mg for a total = 60 mg   Yes Historical Provider, MD  furosemide (LASIX) 20 MG tablet Take 20 mg by mouth every other day.   Yes Historical Provider, MD  HYDROcodone-acetaminophen (NORCO) 10-325 MG tablet Take 1 tablet by mouth every 6 (six) hours as needed for severe pain.   Yes Historical Provider, MD  levothyroxine (SYNTHROID, LEVOTHROID) 75 MCG tablet Take 75  mcg by mouth daily. 07/04/16  Yes Historical Provider, MD  lisinopril-hydrochlorothiazide (PRINZIDE,ZESTORETIC) 20-12.5 MG tablet Take 1 tablet by mouth daily. 07/04/16  Yes Historical Provider, MD  LYRICA 75 MG capsule Take 75 mg by mouth daily. 07/04/16  Yes Historical Provider, MD  metFORMIN (GLUCOPHAGE) 1000 MG tablet Take 1,000 mg by mouth 2 (two) times daily. 07/04/16  Yes Historical Provider, MD  metoprolol succinate (TOPROL-XL) 100 MG 24 hr tablet Take 100 mg by mouth daily. 07/04/16  Yes Historical Provider, MD  potassium chloride (MICRO-K) 10 MEQ CR capsule Take 10 mEq by mouth every other day.  07/04/16  Yes Historical Provider, MD  simvastatin (ZOCOR) 20 MG tablet Take 20 mg by mouth every evening.  07/04/16  Yes Historical Provider, MD    Physical  Exam: Vitals:   07/14/16 1930 07/14/16 1945 07/14/16 2148 07/14/16 2157  BP: (!) 118/96 (!) 109/52  (!) 114/49  Pulse: 76 74  77  Resp: Temp:    98 F (36.7 C)  TempSrc:    Oral  SpO2: 100% 99%  100%  Weight:   109.2 kg (240 lb 12.8 oz)   Height:    (1.651 m)      General:  Appears calm and comfortable and is NAD; loquacious Eyes:  PERRL, EOMI, normal lids, iris ENT:  grossly normal hearing, lips & tongue, mmm Neck:  no LAD, masses or thyromegaly Cardiovascular:  RRR, no m/r/g. No LE edema.  Respiratory:  CTA bilaterally, no w/r/r. Normal respiratory effort. Abdomen:  soft, ntnd, NABS Skin:  LE stasis but the right leg does appear to have erythema, warmth Musculoskeletal:  grossly normal tone BUE/BLE, good ROM, no bony abnormality Psychiatric:  depressed mood and affect, speech fluent and appropriate, AOx3 Neurologic:  CN 2-12 grossly intact, moves all extremities in coordinated fashion, sensation intact  Labs on Admission: I have personally reviewed following labs and imaging studies  CBC:  Recent Labs Lab 07/14/16 1605  WBC 5.4  HGB 9.7*  HCT 30.2*  MCV 94.4  PLT 145*   Basic Metabolic Panel:  Recent Labs Lab 07/14/16 1556  NA 135  K 3.8  CL 99*  CO2 29  GLUCOSE 111*  BUN 15  CREATININE 1.22*  CALCIUM 9.3   GFR: Estimated Creatinine Clearance: 52.8 mL/min (A) (by C-G formula based on SCr of 1.22 mg/dL (H)). Liver Function Tests:  Recent Labs Lab 07/14/16 1556  AST 19  ALT 14  ALKPHOS 64  BILITOT 0.6  PROT 6.6  ALBUMIN 3.2*   No results for input(s): LIPASE, AMYLASE in the last 168 hours. No results for input(s): AMMONIA in the last 168 hours. Coagulation Profile: No results for input(s): INR, PROTIME in the last 168 hours. Cardiac Enzymes: No results for input(s): CKTOTAL, CKMB, CKMBINDEX, TROPONINI in the last 168 hours. BNP (last 3 results) No results for input(s): PROBNP in the last 8760 hours. HbA1C: No results for  input(s): HGBA1C in the last 72 hours. CBG: No results for input(s): GLUCAP in the last 168 hours. Lipid Profile: No results for input(s): CHOL, HDL, LDLCALC, TRIG, CHOLHDL, LDLDIRECT in the last 72 hours. Thyroid Function Tests: No results for input(s): TSH, T4TOTAL, FREET4, T3FREE, THYROIDAB in the last 72 hours. Anemia Panel: No results for input(s): VITAMINB12, FOLATE, FERRITIN, TIBC, IRON, RETICCTPCT in the last 72 hours. Urine analysis:    Component Value Date/Time   COLORURINE YELLOW 07/14/2016 1615   APPEARANCEUR CLEAR 07/14/2016 1615   LABSPEC 1.008 07/14/2016  1615   PHURINE 6.0 07/14/2016 1615   GLUCOSEU NEGATIVE 07/14/2016 1615   HGBUR NEGATIVE 07/14/2016 1615   BILIRUBINUR NEGATIVE 07/14/2016 1615   KETONESUR NEGATIVE 07/14/2016 1615   PROTEINUR NEGATIVE 07/14/2016 1615   NITRITE NEGATIVE 07/14/2016 1615   LEUKOCYTESUR NEGATIVE 07/14/2016 1615    Creatinine Clearance: Estimated Creatinine Clearance: 52.8 mL/min (A) (by C-G formula based on SCr of 1.22 mg/dL (H)).  Sepsis Labs: (procalcitonin:4,lacticidven:4) )No results found for this or any previous visit (from the past 240 hour(s)).   Radiological Exams on Admission: Dg Chest Portable 1 View  Result Date: 07/14/2016 CLINICAL DATA:  Sepsis EXAM: PORTABLE CHEST 1 VIEW COMPARISON:  None. FINDINGS: The heart size and mediastinal contours are within normal limits. Both lungs are clear. The visualized skeletal structures are unremarkable. IMPRESSION: No active disease. Electronically Signed   By: Alcide Clever M.D.   On: 07/14/2016 17:17    EKG: Independently reviewed.  NSR with rate 66; nonspecific ST changes with no evidence of acute ischemia  Assessment/Plan Principal Problem:   Sepsis (HCC) Active Problems:   Cellulitis   Anemia   Thrombocytopenia (HCC)   Hyperglycemia   CKD (chronic kidney disease), stage III   Depressed mood   Sepsis related to cellulitis -Normal WBC count with minimal  tachypnea but elevated lactate to 2.13 (initial was 0.96) and borderline hypotension -While awaiting blood cultures, this could be a preseptic condition. -Sepsis protocol initiated in the ER and she received the full IVF bolus -Patient with chronically poor circulation with worse erythema along the right anterior lower leg consistent with cellulitis, which is most likely the source -She was given Vanc and Zosyn in the ER, will continue -Blood cultures pending -Will admit and continue to monitor -Will trend lactate to ensure improvement and check procalcitonin -UA negative  Anemia -Hgb 9.7 -Uncertain baseline -No apparent bleeding -Will follow  Thrombocytopenia -Platelets 145 -Uncertain baseline -Will follow  Hyperglycemia -Glucose 111 -May be stress response -Will follow with fasting AM labs  CKD -Creatinine 1.22, GFR 44 -While it is possible that this has an acute component, it seems equally likely that this is chronic renal dysfunction -Given IVF, will trend  Depressed mood -The patient seemed quite down, emotionally labile -She voiced home stressors -She was open to the idea of psychiatry assistance, would consider consultation -SW may be another valuable resource for this patient.  DVT prophylaxis: Lovenox Code Status: DNR - confirmed with patient Family Communication: None present Disposition Plan:  Home once clinically improved Consults called: None, consider psychiatry  Admission status: Admit - It is my clinical opinion that admission to INPATIENT is reasonable and necessary because this patient will require at least 2 midnights in the hospital to treat this condition based on the medical complexity of the problems presented.  Given the aforementioned information, the predictability of an adverse outcome is felt to be significant.    Jonah Blue MD [facility]  If 7PM-7AM, please contact night-coverage [email or web address] Password [email or web address]  07/15/2016, 1:48  AM

## 2016-07-14 NOTE — ED Provider Notes (Signed)
Signout from Dr. Patria Mane. Blood pressure remained stable after IV fluids. Discussed with hospitalist and will admit for cellulitis.   Loren Racer, MD 07/14/16 [place]

## 2016-07-14 NOTE — Progress Notes (Signed)
Pharmacy Antibiotic Note  Crystal King is a 71 y.o. female admitted on 07/14/2016 with sepsis 2/2 cellulitis. Pharmacy has been consulted for vancomycin and Zosyn dosing. Patient is afebrile, WBC 5.4, lactic acid 2.13, CrCl ~51 ml/min  Plan: Vancomycin 2000 mg x1 then 1250 mg IV q24h.  Goal trough 15-20 mcg/mL. Zosyn 3.375g IV q8h (4 hour infusion). Monitor renal function, cultures, ability to de-escalate  Temp (24hrs), Avg:98 F (36.7 C), Min:98 F (36.7 C), Max:98 F (36.7 C)  No results for input(s): WBC, CREATININE, LATICACIDVEN, VANCOTROUGH, VANCOPEAK, VANCORANDOM, GENTTROUGH, GENTPEAK, GENTRANDOM, TOBRATROUGH, TOBRAPEAK, TOBRARND, AMIKACINPEAK, AMIKACINTROU, AMIKACIN in the last 168 hours.  CrCl cannot be calculated (No order found.).    Allergies not on file  Antimicrobials this admission: 5/2 vanc >>  5/2 Zosyn >>   Dose adjustments this admission: n/a  Microbiology results: 5/2 BCx: sent  Thank you for allowing pharmacy to be a part of this patient's care.   Mackie Pai, PharmD PGY1 Pharmacy Resident Pager: [phone] 07/14/2016 5:56 PM

## 2016-07-15 DIAGNOSIS — N183 Chronic kidney disease, stage 3 unspecified: Secondary | ICD-10-CM | POA: Diagnosis present

## 2016-07-15 DIAGNOSIS — D696 Thrombocytopenia, unspecified: Secondary | ICD-10-CM | POA: Diagnosis present

## 2016-07-15 DIAGNOSIS — F329 Major depressive disorder, single episode, unspecified: Secondary | ICD-10-CM

## 2016-07-15 DIAGNOSIS — N179 Acute kidney failure, unspecified: Secondary | ICD-10-CM

## 2016-07-15 DIAGNOSIS — D649 Anemia, unspecified: Secondary | ICD-10-CM | POA: Diagnosis present

## 2016-07-15 DIAGNOSIS — L0390 Cellulitis, unspecified: Secondary | ICD-10-CM | POA: Diagnosis present

## 2016-07-15 DIAGNOSIS — R4589 Other symptoms and signs involving emotional state: Secondary | ICD-10-CM | POA: Diagnosis present

## 2016-07-15 DIAGNOSIS — R739 Hyperglycemia, unspecified: Secondary | ICD-10-CM | POA: Diagnosis present

## 2016-07-15 LAB — BASIC METABOLIC PANEL
ANION GAP: 8 (ref 5–15)
BUN: 11 mg/dL (ref 6–20)
CO2: 26 mmol/L (ref 22–32)
CREATININE: 1.04 mg/dL — AB (ref 0.44–1.00)
Calcium: 8.9 mg/dL (ref 8.9–10.3)
Chloride: 104 mmol/L (ref 101–111)
GFR calc non Af Amer: 53 mL/min — ABNORMAL LOW (ref >60)
Glucose, Bld: 114 mg/dL — ABNORMAL HIGH (ref 65–99)
POTASSIUM: 3.8 mmol/L (ref 3.5–5.1)
SODIUM: 138 mmol/L (ref 135–145)

## 2016-07-15 LAB — CBC
HEMATOCRIT: 31.6 % — AB (ref 36.0–46.0)
HEMOGLOBIN: 10.5 g/dL — AB (ref 12.0–15.0)
MCH: 31.2 pg (ref 26.0–34.0)
MCHC: 33.2 g/dL (ref 30.0–36.0)
MCV: 93.8 fL (ref 78.0–100.0)
PLATELETS: 134 10*3/uL — AB (ref 150–400)
RBC: 3.37 MIL/uL — AB (ref 3.87–5.11)
RDW: 12.5 % (ref 11.5–15.5)
WBC: 5.2 10*3/uL (ref 4.0–10.5)

## 2016-07-15 LAB — LACTIC ACID, PLASMA
Lactic Acid, Venous: 0.9 mmol/L (ref 0.5–1.9)
Lactic Acid, Venous: 0.7 mmol/L (ref 0.5–1.9)

## 2016-07-15 LAB — MRSA PCR SCREENING: MRSA by PCR: NEGATIVE

## 2016-07-15 LAB — HIV ANTIBODY (ROUTINE TESTING W REFLEX): HIV Screen 4th Generation wRfx: NONREACTIVE

## 2016-07-15 LAB — PROTIME-INR
INR: 1.14
Prothrombin Time: 14.7 seconds (ref 11.4–15.2)

## 2016-07-15 LAB — GLUCOSE, CAPILLARY
GLUCOSE-CAPILLARY: 103 mg/dL — AB (ref 65–99)
GLUCOSE-CAPILLARY: 150 mg/dL — AB (ref 65–99)
Glucose-Capillary: 114 mg/dL — ABNORMAL HIGH (ref 65–99)
Glucose-Capillary: 135 mg/dL — ABNORMAL HIGH (ref 65–99)

## 2016-07-15 LAB — TSH: TSH: 1.74 u[IU]/mL (ref 0.350–4.500)

## 2016-07-15 LAB — APTT: aPTT: 33 seconds (ref 24–36)

## 2016-07-15 LAB — PROCALCITONIN: Procalcitonin: <0.1 ng/mL

## 2016-07-15 MED ORDER — METOPROLOL SUCCINATE ER 25 MG PO TB24
25.0000 mg | ORAL_TABLET | Freq: Every day | ORAL | Status: DC
  Administered 2016-07-15 – 2016-07-16 (×2): 25 mg via ORAL
  Filled 2016-07-15 (×2): qty 1

## 2016-07-15 MED ORDER — HYDROCODONE-ACETAMINOPHEN 7.5-325 MG/15ML PO SOLN
ORAL | Status: AC
  Administered 2016-07-15: 15 mL
  Filled 2016-07-15: qty 15

## 2016-07-15 MED ORDER — ENOXAPARIN SODIUM 60 MG/0.6ML ~~LOC~~ SOLN
50.0000 mg | Freq: Every day | SUBCUTANEOUS | Status: DC
  Administered 2016-07-16: 50 mg via SUBCUTANEOUS
  Filled 2016-07-15: qty 0.6

## 2016-07-15 NOTE — Evaluation (Signed)
Physical Therapy Evaluation Patient Details Name: Crystal King MRN: [id number] DOB: 1945-10-06 Today's Date: 07/15/2016   History of Present Illness   ELLENA KAMEN is a 71 y.o. female with medical history significant of HTN, hypothyroidism, CHF, and back pain (records unavailable) presenting with leg left pain.  Pt with L LE cellulitis and started on sepsis protocol.  Clinical Impression  Pt admitted with/for left leg pain due to cellulitis.  If pt does not need to get OOB, which she does not do at home, pt is at a min to min guard level for basic mobility/gait.Marland Kitchen  Pt currently limited functionally due to the problems listed below.  (see problems list.)  Pt will benefit from PT to maximize function and safety to be able to get home safely with available assist of husband.     Follow Up Recommendations Home health PT    Equipment Recommendations  None recommended by PT    Recommendations for Other Services       Precautions / Restrictions Precautions Precautions: Fall      Mobility  Bed Mobility Overal bed mobility: Needs Assistance Bed Mobility: Supine to Sit     Supine to sit: Min assist (with rail, mod assist with no AD)     General bed mobility comments: pt does not sleep in a bed, but uses a recliner.  Transfers Overall transfer level: Needs assistance   Transfers: Sit to/from Stand Sit to Stand: Min guard         General transfer comment: cues for hand placement no assist to stand, but struggle for pt.  Ambulation/Gait Ambulation/Gait assistance: Min guard Ambulation Distance (Feet): 150 Feet Assistive device: Rolling walker (2 wheeled) Gait Pattern/deviations: Step-through pattern   Gait velocity interpretation: Below normal speed for age/gender General Gait Details: generally steady with safe use of the RW  Stairs            Wheelchair Mobility    Modified Rankin (Stroke Patients Only)       Balance Overall balance assessment: Needs  assistance   Sitting balance-Leahy Scale: Fair Sitting balance - Comments: steady at EOB without UE assist and accepting minimal challenge     Standing balance-Leahy Scale: Fair Standing balance comment: prefers use of the RW, but can stand statically withut RW                             Pertinent Vitals/Pain Pain Assessment: Faces Faces Pain Scale: Hurts little more Pain Location: L LEG Pain Descriptors / Indicators: Sore Pain Intervention(s): Monitored during session    Home Living Family/patient expects to be discharged to:: Private residence Living Arrangements: Spouse/significant other Available Help at Discharge: Family;Available 24 hours/day (pt states husband is NOT a caregiver) Type of Home: Mobile home Home Access: Stairs to enter Entrance Stairs-Rails: Lawyer of Steps: 5 Home Layout: One level Home Equipment: Walker - 2 wheels;Grab bars - tub/shower      Prior Function Level of Independence: Independent with assistive device(s)         Comments: pt has been taking sponge baths and generally stays in the home unless needing to go to the doctor.     Hand Dominance        Extremity/Trunk Assessment        Lower Extremity Assessment Lower Extremity Assessment: Overall WFL for tasks assessed;Generalized weakness       Communication   Communication: No difficulties  Cognition Arousal/Alertness:  Awake/alert Behavior During Therapy: WFL for tasks assessed/performed Overall Cognitive Status: Within Functional Limits for tasks assessed                                        General Comments      Exercises     Assessment/Plan    PT Assessment Patient needs continued PT services  PT Problem List Decreased strength;Decreased activity tolerance;Decreased mobility;Decreased balance;Pain       PT Treatment Interventions Gait training;Functional mobility training;Therapeutic activities;Balance  training;Patient/family education;DME instruction;Stair training    PT Goals (Current goals can be found in the Care Plan section)  Acute Rehab PT Goals Patient Stated Goal: get back home PT Goal Formulation: With patient Time For Goal Achievement: 07/22/16 Potential to Achieve Goals: Good    Frequency Min 3X/week   Barriers to discharge        Co-evaluation               AM-PAC PT "6 Clicks" Daily Activity  Outcome Measure Difficulty turning over in bed (including adjusting bedclothes, sheets and blankets)?: Total Difficulty moving from lying on back to sitting on the side of the bed? : Total Difficulty sitting down on and standing up from a chair with arms (e.g., wheelchair, bedside commode, etc,.)?: A Little Help needed moving to and from a bed to chair (including a wheelchair)?: A Little Help needed walking in hospital room?: A Little Help needed climbing 3-5 steps with a railing? : A Little 6 Click Score: 14    End of Session   Activity Tolerance: Patient tolerated treatment well Patient left: in chair;with call bell/phone within reach;with chair alarm set Nurse Communication: Mobility status PT Visit Diagnosis: Other abnormalities of gait and mobility (R26.89);Muscle weakness (generalized) (M62.81)    Time: [id number] PT Time Calculation (min) (ACUTE ONLY): 26 min   Charges:   PT Evaluation $PT Eval Moderate Complexity: 1 Procedure PT Treatments $Gait Training: 8-22 mins   PT G Codes:        07/15/2016  [place] BingKen Kiyomi Pallo, PT [phone]  (pager)  Eliseo GumKenneth V Zoua Caporaso 07/15/2016, 4:55 PM

## 2016-07-15 NOTE — Progress Notes (Addendum)
PROGRESS NOTE   MALCOLM HETZ  ZOX:[id number]    DOB: [birth year]    DOA: 07/14/2016  PCP: Karen Kays, MD   I have briefly reviewed patients previous medical records in [facility].  Brief Narrative:  71 year old married female, lives with spouse, ambulates with the help of a walker, PMH of HTN, hypothyroid, chronic CHF, back pain, frequent falls, sustained a fall the day prior to admission, paramedics felt that it was best if she came to the hospital admitted for possible left leg cellulitis. She has appointment for back surgery 5/16.   Assessment & Plan:   Principal Problem:   Sepsis (HCC) Active Problems:   Cellulitis   Anemia   Thrombocytopenia (HCC)   Hyperglycemia   CKD (chronic kidney disease), stage III   Depressed mood   1. Lower extremity cellulitis: Complicating pretibial healing wounds from venous stasis on left. Also has healing skin tear left knee from a fall at home. Did not fully meet sepsis criteria on admission. Did receive treatment per sepsis protocol with IV fluids and empirically started on IV vancomycin and Zosyn. Clinically improved. WOC input appreciated. Has good peripheral pulses. Blood cultures 2: No growth to date. 2. Frequent falls: Probably multifactorial related to age, multiple comorbidities, deconditioning and possible dementia. PT evaluation. 3. Anemia: Stable. Baseline not available. 4. Thrombocytopenia: Stable. Baseline not known. Follow CBCs. 5. Hypotension/essential hypertension: Hypotensive on initial arrival. Resolved. Resume Toprol-XL 25 MG daily (100 MG daily at home) to avoid withdrawal tachycardia. Holding ACEI/HCTZ 6. HLD: Statins. 7. Acute kidney injury: Presented with creatinine of 1.22. Baseline creatinine not known. Creatinine has normalized.Holding ACEI/HCTZ. 8. DM 2: Check A1c. SSI. 9. Possible dementia/Depression: Currently on fluoxetine. No suicidal or homicidal ideations. Outpatient psychiatry consultation as deemed  necessary. 10. Hypothyroid: Continue Synthroid. Check TSH. 11. Chronic CHF, unknown type: Clinically compensated. May consider outpatient echo if not already done. Lasix on hold, resume at discharge. DC IV fluids.   DVT prophylaxis: Lovenox Code Status: DO NOT RESUSCITATE Family Communication: None at bedside Disposition: DC home possibly 5/4 pending PT eval and further clinical improvement   Consultants:  None   Procedures:  None  Antimicrobials:  IV vancomycin and Zosyn.    Subjective: Seen this morning. Complained of pain in her legs. States that the lesion on her left shin started off as a blister, seen by PCP, topical treatment recommended then got worse but is improving.    ROS: Denies suicidal or homicidal ideations or auditory or visual hallucinations.  Objective:  Vitals:   07/14/16 1945 07/14/16 2148 07/14/16 2157 07/15/16 0642  BP: (!) 109/52  (!) 114/49 (!) 118/55  Pulse: 74  77 67  Resp: 17  18 18   Temp:   98 F (36.7 C) 98.8 F (37.1 C)  TempSrc:   Oral Oral  SpO2: 99%  100% 96%  Weight:  109.2 kg (240 lb 12.8 oz)    Height:  5\' 5"  (1.651 m)      Examination:  General exam: Pleasant elderly female lying comfortably propped up in bed. Respiratory system: Clear to auscultation. Respiratory effort normal. Cardiovascular system: S1 & S2 heard, RRR. No JVD, murmurs, rubs, gallops or clicks. No pedal edema. Not on telemetry. Gastrointestinal system: Abdomen is nondistended, soft and nontender. No organomegaly or masses felt. Normal bowel sounds heard. Central nervous system: Alert and oriented 2. No focal neurological deficits. Extremities: Symmetric 5 x 5 power. Left shin with healing superficial wounds, trace edema, faint erythema surrounding  the wounds with mild tenderness and increased warmth. Right leg with mild increased warmth but no open wounds or redness. Skin: Please see above. Please see picture of left leg below. Psychiatry: Judgement and  insight appear impaired. Mood & affect , slightly anxious.       Data Reviewed: I have personally reviewed following labs and imaging studies  CBC:  Recent Labs Lab 07/14/16 1605 07/15/16 0259  WBC 5.4 5.2  HGB 9.7* 10.5*  HCT 30.2* 31.6*  MCV 94.4 93.8  PLT 145* 134*   Basic Metabolic Panel:  Recent Labs Lab 07/14/16 1556 07/15/16 0259  NA 135 138  K 3.8 3.8  CL 99* 104  CO2 29 26  GLUCOSE 111* 114*  BUN 15 11  CREATININE 1.22* 1.04*  CALCIUM 9.3 8.9   Liver Function Tests:  Recent Labs Lab 07/14/16 1556  AST 19  ALT 14  ALKPHOS 64  BILITOT 0.6  PROT 6.6  ALBUMIN 3.2*   Coagulation Profile:  Recent Labs Lab 07/15/16 0000  INR 1.14   CBG:  Recent Labs Lab 07/15/16 0809  GLUCAP 103*    Recent Results (from the past 240 hour(s))  Blood Culture (routine x 2)     Status: None (Preliminary result)   Collection Time: 07/14/16  4:05 PM  Result Value Ref Range Status   Specimen Description BLOOD RIGHT ANTECUBITAL  Final   Special Requests IN PEDIATRIC BOTTLE Blood Culture adequate volume  Final   Culture NO GROWTH < 24 HOURS  Final   Report Status PENDING  Incomplete  Blood Culture (routine x 2)     Status: None (Preliminary result)   Collection Time: 07/14/16  4:13 PM  Result Value Ref Range Status   Specimen Description BLOOD LEFT ANTECUBITAL  Final   Special Requests   Final    BOTTLES DRAWN AEROBIC AND ANAEROBIC Blood Culture results may not be optimal due to an excessive volume of blood received in culture bottles   Culture NO GROWTH < 24 HOURS  Final   Report Status PENDING  Incomplete  MRSA PCR Screening     Status: None   Collection Time: 07/15/16  6:02 AM  Result Value Ref Range Status   MRSA by PCR NEGATIVE NEGATIVE Final    Comment:        The GeneXpert MRSA Assay (FDA approved for NASAL specimens only), is one component of a comprehensive MRSA colonization surveillance program. It is not intended to diagnose MRSA infection  nor to guide or monitor treatment for MRSA infections.          Radiology Studies: Dg Chest Portable 1 View  Result Date: 07/14/2016 CLINICAL DATA:  Sepsis EXAM: PORTABLE CHEST 1 VIEW COMPARISON:  None. FINDINGS: The heart size and mediastinal contours are within normal limits. Both lungs are clear. The visualized skeletal structures are unremarkable. IMPRESSION: No active disease. Electronically Signed   By: Alcide CleverMark  Lukens M.D.   On: 07/14/2016 17:17        Scheduled Meds: . docusate sodium  100 mg Oral BID  . [START ON 07/16/2016] enoxaparin (LOVENOX) injection  50 mg Subcutaneous Daily  . FLUoxetine  60 mg Oral Daily  . insulin aspart  0-15 Units Subcutaneous TID WC  . levothyroxine  75 mcg Oral QAC breakfast  . pregabalin  75 mg Oral Daily  . simvastatin  20 mg Oral QPM   Continuous Infusions: . sodium chloride 125 mL/hr at 07/15/16 0000  . piperacillin-tazobactam (ZOSYN)  IV Stopped (07/15/16 1010)  .  vancomycin       LOS: 1 day     Lajean Boese, MD, FACP, FHM. [facility] Pager [phone]  If 7PM-7AM, please contact night-coverage [email or web address] Password [email or web address] 07/15/2016, 12:42 PM

## 2016-07-15 NOTE — Consult Note (Signed)
WOC Nurse wound consult note Reason for Consult:left leg cellulitis Wound type: skin tear left knee from trauma, fall at home Venous stasis with healed skin changes on the LLE pretibial.  Patient pointed out some additional scarring on the right LE, from reaction to prednisone Bilateral LE with edema, she does not report any use of compression stockings at home Dressing procedure/placement/frequency: no topical care needed for the skin changes or healed areas. Add silicone foam to protect the left knee wound, change every 5 days.   Discussed POC with patient and bedside nurse.  Re consult if needed, will not follow at this time. Thanks  Xavian Hardcastle M.D.C. [employer] MSN, RN,CWOCN, CNS [phone])

## 2016-07-16 ENCOUNTER — Inpatient Hospital Stay (HOSPITAL_COMMUNITY): Payer: Medicare HMO

## 2016-07-16 DIAGNOSIS — M79605 Pain in left leg: Secondary | ICD-10-CM

## 2016-07-16 DIAGNOSIS — D61818 Other pancytopenia: Secondary | ICD-10-CM

## 2016-07-16 DIAGNOSIS — E1140 Type 2 diabetes mellitus with diabetic neuropathy, unspecified: Secondary | ICD-10-CM

## 2016-07-16 DIAGNOSIS — R1032 Left lower quadrant pain: Secondary | ICD-10-CM

## 2016-07-16 DIAGNOSIS — M79604 Pain in right leg: Secondary | ICD-10-CM

## 2016-07-16 LAB — HEMOGLOBIN A1C
HEMOGLOBIN A1C: 6.1 % — AB (ref 4.8–5.6)
Mean Plasma Glucose: 128 mg/dL

## 2016-07-16 LAB — GLUCOSE, CAPILLARY
Glucose-Capillary: 146 mg/dL — ABNORMAL HIGH (ref 65–99)
Glucose-Capillary: 124 mg/dL — ABNORMAL HIGH (ref 65–99)

## 2016-07-16 LAB — CBC
HCT: 33 % — ABNORMAL LOW (ref 36.0–46.0)
Hemoglobin: 10.8 g/dL — ABNORMAL LOW (ref 12.0–15.0)
MCH: 31 pg (ref 26.0–34.0)
MCHC: 32.7 g/dL (ref 30.0–36.0)
MCV: 94.8 fL (ref 78.0–100.0)
PLATELETS: 126 10*3/uL — AB (ref 150–400)
RBC: 3.48 MIL/uL — ABNORMAL LOW (ref 3.87–5.11)
RDW: 12.7 % (ref 11.5–15.5)
WBC: 3.4 10*3/uL — AB (ref 4.0–10.5)

## 2016-07-16 MED ORDER — DOXYCYCLINE HYCLATE 100 MG PO TABS
100.0000 mg | ORAL_TABLET | Freq: Two times a day (BID) | ORAL | Status: DC
  Administered 2016-07-16: 100 mg via ORAL
  Filled 2016-07-16: qty 1

## 2016-07-16 MED ORDER — DOXYCYCLINE HYCLATE 100 MG PO CAPS: 100.0000 mg | ORAL_CAPSULE | Freq: Two times a day (BID) | ORAL | 0 refills | Status: AC

## 2016-07-16 NOTE — Progress Notes (Signed)
**  Preliminary report by tech**  Bilateral lower extremity venous duplex completed. There is no obvious evidence of deep or superficial vein thrombosis involving the right and left lower extremities. All clearly visualized vessels appear patent and compressible. There is no evidence of Baker's cysts bilaterally.  07/16/16 12:03 PM Olen CordialGreg Smriti Barkow RVT

## 2016-07-16 NOTE — Discharge Summary (Signed)
Physician Discharge Summary  Crystal King:[id number] DOB: [birth year]  PCP: Karen Kays, MD  Admit date: 07/14/2016 Discharge date: 07/16/2016  Recommendations for Outpatient Follow-up:  1. Dr. Mikle Bosworth, PCP in 3 days with repeat labs (CBC & BMP). Please follow final blood culture results that were sent from the hospital. 2. Consider outpatient Psychiatry evaluation.    Home Health: PT, OT, RN, aide & social worker Equipment/Devices: None    Discharge Condition: Improved and stable  CODE STATUS: DO NOT RESUSCITATE  Diet recommendation: Heart healthy & diabetic diet.  Discharge Diagnoses:  Principal Problem:   Sepsis (HCC) Active Problems:   Cellulitis   Anemia   Thrombocytopenia (HCC)   Hyperglycemia   CKD (chronic kidney disease), stage III   Depressed mood   Brief Summary: 71 year old married female, lives with spouse, ambulates with the help of a walker, PMH of HTN, hypothyroid, chronic CHF, back pain, frequent falls, sustained a fall the day prior to admission, paramedics felt that it was best if she came to the hospital admitted for bilateral lower extremity cellulitis. She has appointment for back surgery 5/16.   Assessment & Plan:   1. Bilateral leg cellulitis: Complicating pretibial healing wounds from venous stasis on left. Also has healing skin tear left knee from a fall at home. Did not fully meet sepsis criteria on admission. Did receive treatment per sepsis protocol with IV fluids and empirically started on IV vancomycin and Zosyn. Clinically improved. WOC input appreciated. Has good peripheral pulses. Blood cultures 2: No growth to date. Transitioned to oral doxycycline and complete total one-week treatment. Patient reported pain in the left groin-no acute findings on local exam. X-ray of pelvis and left hip negative for fractures. Lower extremity venous Doppler negative for DVT. Denies back pain. 2. Frequent falls: Probably multifactorial related  to age, multiple comorbidities, deconditioning and possible dementia. PT recommended home health PT. Patient stated that her spouse is frail and may not be able to care for her. Discussed in detail with case management and clinical social worker who advised that patient does not meet criteria for SNF (insurance will not cover). Hence maximized home health services as stated above. 3. Pancytopenia: Hemoglobin and platelet counts stable. Baseline not available. Mild leukopenia. ? Related to infection Vs Abx. Follow CBCs closely as outpatient and consider further workup as needed. 4. Hypotension/essential hypertension: Hypotensive on initial arrival. Resolved. Continue prior home medications at discharge. 5. HLD: Statins. 6. Acute kidney injury: Presented with creatinine of 1.22. Baseline creatinine not known. Creatinine has normalized.ACEI/HCTZ were temporarily held while hospitalized, resume at discharge. 7. DM 2:  A1c: 6.1. Continue metformin. 8. Possible dementia/Depression: Currently on fluoxetine. No suicidal or homicidal ideations. Outpatient psychiatry consultation as deemed necessary. 9. Hypothyroid: Continue Synthroid. TSH: 1.740. Clinically euthyroid. 10. Chronic CHF, unknown type: Clinically compensated. May consider outpatient echo if not already done. Lasix on hold, resume at discharge. DC IV fluids.   Consultants:  None   Procedures:  None   Discharge Instructions  Discharge Instructions    Call MD for:  difficulty breathing, headache or visual disturbances    Complete by:  As directed    Call MD for:  extreme fatigue    Complete by:  As directed    Call MD for:  persistant dizziness or light-headedness    Complete by:  As directed    Call MD for:  persistant nausea and vomiting    Complete by:  As directed    Call MD  for:  redness, tenderness, or signs of infection (pain, swelling, redness, odor or green/yellow discharge around incision site)    Complete by:  As  directed    Call MD for:  severe uncontrolled pain    Complete by:  As directed    Call MD for:  temperature >100.4    Complete by:  As directed    Diet - low sodium heart healthy    Complete by:  As directed    Diet Carb Modified    Complete by:  As directed    Discharge wound care:    Complete by:  As directed    Add silicone foam to protect the left knee wound, change every 5 days.   Increase activity slowly    Complete by:  As directed        Medication List    TAKE these medications   doxycycline 100 MG capsule Commonly known as:  VIBRAMYCIN Take 1 capsule (100 mg total) by mouth 2 (two) times daily.   FLUoxetine 40 MG capsule Commonly known as:  PROZAC Take 40 mg by mouth daily. Take w/ 20 mg for a total = 60 mg   FLUoxetine 20 MG capsule Commonly known as:  PROZAC Take 20 mg by mouth daily. Take wit 40 mg for total = 60 mg   furosemide 20 MG tablet Commonly known as:  LASIX Take 20 mg by mouth every other day.   HYDROcodone-acetaminophen 10-325 MG tablet Commonly known as:  NORCO Take 1 tablet by mouth every 6 (six) hours as needed for severe pain.   levothyroxine 75 MCG tablet Commonly known as:  SYNTHROID, LEVOTHROID Take 75 mcg by mouth daily.   lisinopril-hydrochlorothiazide 20-12.5 MG tablet Commonly known as:  PRINZIDE,ZESTORETIC Take 1 tablet by mouth daily.   LYRICA 75 MG capsule Generic drug:  pregabalin Take 75 mg by mouth daily.   metFORMIN 1000 MG tablet Commonly known as:  GLUCOPHAGE Take 1,000 mg by mouth 2 (two) times daily.   metoprolol succinate 100 MG 24 hr tablet Commonly known as:  TOPROL-XL Take 100 mg by mouth daily.   potassium chloride 10 MEQ CR capsule Commonly known as:  MICRO-K Take 10 mEq by mouth every other day.   simvastatin 20 MG tablet Commonly known as:  ZOCOR Take 20 mg by mouth every evening.   VITAMIN D PO Take 1 tablet by mouth 2 (two) times a week. Mon / Fri      Follow-up Information    ROBINSON,  ROBERT C III, MD Follow up in 3 day(s).   Specialty:  Internal Medicine Why:  To be seen with repeat labs (CBC & BMP). Contact information: [address] [place] [address] [phone]          No Known Allergies    Procedures/Studies: Dg Chest Portable 1 View  Result Date: 07/14/2016 CLINICAL DATA:  Sepsis EXAM: PORTABLE CHEST 1 VIEW COMPARISON:  None. FINDINGS: The heart size and mediastinal contours are within normal limits. Both lungs are clear. The visualized skeletal structures are unremarkable. IMPRESSION: No active disease. Electronically Signed   By: Alcide Clever M.D.   On: 07/14/2016 17:17   Dg Hip Unilat With Pelvis 2-3 Views Left  Result Date: 07/16/2016 CLINICAL DATA:  Multiple falls, left groin pain EXAM: DG HIP (WITH OR WITHOUT PELVIS) 2-3V LEFT COMPARISON:  None. FINDINGS: No fracture or dislocation is seen. Bilateral hip joint spaces are preserved. Visualized bony pelvis appears intact. Degenerative changes of the lower  lumbar spine. IMPRESSION: Negative. Electronically Signed   By: Charline Bills M.D.   On: 07/16/2016 09:37  Bilateral lower extremity venous Dopplers 07/16/16: Bilateral lower extremity venous duplex completed. There is no obvious evidence of deep or superficial vein thrombosis involving the right and left lower extremities. All clearly visualized vessels appear patent and compressible. There is no evidence of Baker's cysts bilaterally.    Subjective: Seen this morning. No leg pain reported. Complained of pain in the left groin. Denied back pain. No tingling, numbness or weakness reported. Denied sphincter related complaints.  Discharge Exam:  Vitals:   07/15/16 2207 07/16/16 0519 07/16/16 0818 07/16/16 1335  BP: (!) 102/51 132/60 (!) 152/81 (!) 147/75  Pulse: 71 69 83 79  Resp: 18 18  16   Temp: 97.9 F (36.6 C)   98.4 F (36.9 C)  TempSrc: Oral   Oral  SpO2: 97% 99%  100%  Weight:      Height:        General exam:  Pleasant elderly female lying comfortably propped up in bed. Respiratory system: Clear to auscultation. Respiratory effort normal. Cardiovascular system: S1 & S2 heard, RRR. No JVD, murmurs, rubs, gallops or clicks. No pedal edema. Not on telemetry. Gastrointestinal system: Abdomen is nondistended, soft and nontender. No organomegaly or masses felt. Normal bowel sounds heard. Central nervous system: Alert and oriented 2. No focal neurological deficits. Extremities: Symmetric 5 x 5 power. Left shin with healing superficial wounds, trace edema. Erythema, increased warmth and tenderness have all resolved. Right leg without any acute findings. Picture below is of left leg from day prior. Skin: Please see above. Please see picture of left leg below. Psychiatry: Judgement and insight appear impaired. Mood & affect , slightly anxious.      The results of significant diagnostics from this hospitalization (including imaging, microbiology, ancillary and laboratory) are listed below for reference.     Microbiology: Recent Results (from the past 240 hour(s))  Blood Culture (routine x 2)     Status: None (Preliminary result)   Collection Time: 07/14/16  4:05 PM  Result Value Ref Range Status   Specimen Description BLOOD RIGHT ANTECUBITAL  Final   Special Requests IN PEDIATRIC BOTTLE Blood Culture adequate volume  Final   Culture NO GROWTH 2 DAYS  Final   Report Status PENDING  Incomplete  Blood Culture (routine x 2)     Status: None (Preliminary result)   Collection Time: 07/14/16  4:13 PM  Result Value Ref Range Status   Specimen Description BLOOD LEFT ANTECUBITAL  Final   Special Requests   Final    BOTTLES DRAWN AEROBIC AND ANAEROBIC Blood Culture results may not be optimal due to an excessive volume of blood received in culture bottles   Culture NO GROWTH 2 DAYS  Final   Report Status PENDING  Incomplete  MRSA PCR Screening     Status: None   Collection Time: 07/15/16  6:02 AM  Result  Value Ref Range Status   MRSA by PCR NEGATIVE NEGATIVE Final    Comment:        The GeneXpert MRSA Assay (FDA approved for NASAL specimens only), is one component of a comprehensive MRSA colonization surveillance program. It is not intended to diagnose MRSA infection nor to guide or monitor treatment for MRSA infections.      Labs: CBC:  Recent Labs Lab 07/14/16 1605 07/15/16 0259 07/16/16 0437  WBC 5.4 5.2 3.4*  HGB 9.7* 10.5* 10.8*  HCT 30.2* 31.6* 33.0*  MCV 94.4 93.8 94.8  PLT 145* 134* 126*   Basic Metabolic Panel:  Recent Labs Lab 07/14/16 1556 07/15/16 0259  NA 135 138  K 3.8 3.8  CL 99* 104  CO2 29 26  GLUCOSE 111* 114*  BUN 15 11  CREATININE 1.22* 1.04*  CALCIUM 9.3 8.9   Liver Function Tests:  Recent Labs Lab 07/14/16 1556  AST 19  ALT 14  ALKPHOS 64  BILITOT 0.6  PROT 6.6  ALBUMIN 3.2*   CBG:  Recent Labs Lab 07/15/16 1243 07/15/16 1753 07/15/16 2130 07/16/16 0758 07/16/16 1216  GLUCAP 150* 114* 135* 146* 124*   Hgb A1c  Recent Labs  07/15/16 0000  HGBA1C 6.1*   Thyroid function studies  Recent Labs  07/15/16 1330  TSH 1.740   Urinalysis    Component Value Date/Time   COLORURINE YELLOW 07/14/2016 1615   APPEARANCEUR CLEAR 07/14/2016 1615   LABSPEC 1.008 07/14/2016 1615   PHURINE 6.0 07/14/2016 1615   GLUCOSEU NEGATIVE 07/14/2016 1615   HGBUR NEGATIVE 07/14/2016 1615   BILIRUBINUR NEGATIVE 07/14/2016 1615   KETONESUR NEGATIVE 07/14/2016 1615   PROTEINUR NEGATIVE 07/14/2016 1615   NITRITE NEGATIVE 07/14/2016 1615   LEUKOCYTESUR NEGATIVE 07/14/2016 1615    Called and discussed with spouse. Updated care and answered questions.  Time coordinating discharge: Over 30 minutes  SIGNED:  Marcellus ScottHONGALGI,Vick Filter, MD, FACP, FHM. [facility] Pager [phone]  If 7PM-7AM, please contact night-coverage [email or web address] Password [email or web address] 07/16/2016, 3:07 PM

## 2016-07-16 NOTE — Care Management Note (Addendum)
Case Management Note  Patient Details  Name: Crystal King MRN: [id number] Date of Birth: [birth year]  Subjective/Objective:      Admitted with L LE cellulitis / sepsis,  history  of HTN, hypothyroidism, CHF, and back pain. Resides with husband.  Henderson BaltimoreWallace Merendino (Spouse)     [phone]       PCP: Mikle Bosworthobert Robinson  Action/Plan: Plan is to d/c to home with home health services when medically stable. CM to f/u with disposition needs.  Expected Discharge Date:    07/16/2016              Expected Discharge Plan:  Home w Home Health Services  In-House Referral:  Clinical Social Work  Discharge planning Services  CM Consult  Post Acute Care Choice:    Choice offered to:  Patient  DME Arranged:   [facility]  [facility] referral made with Brad.  HH Arranged:   RN,PT,OT,NA,SW [facility]  Status of Service:   completed  If discussed at Long Length of Stay Meetings, dates discussed:    Additional Comments:  Epifanio LeschesCole, Eh Sesay Hudson, RN 07/16/2016, 11:12 AM

## 2016-07-16 NOTE — Progress Notes (Signed)
Crystal King to be D/C'd Home per MD order.  Discussed with the patient and all questions fully answered.  IV catheter discontinued intact. Site without signs and symptoms of complications. Dressing and pressure applied.  An After Visit Summary was printed and given to the patient. Patient received prescription.  D/c education completed with patient/family including follow up instructions, medication list, d/c activities limitations if indicated, with other d/c instructions as indicated by MD - patient able to verbalize understanding, all questions fully answered.   Patient instructed to return to ED, call 911, or call MD for any changes in condition.   Patient escorted via WC, and D/C home via private auto.  [name] 07/16/2016 4:20 PM

## 2016-07-19 DIAGNOSIS — E1165 Type 2 diabetes mellitus with hyperglycemia: Secondary | ICD-10-CM | POA: Diagnosis not present

## 2016-07-19 DIAGNOSIS — F329 Major depressive disorder, single episode, unspecified: Secondary | ICD-10-CM | POA: Diagnosis not present

## 2016-07-19 DIAGNOSIS — Z7984 Long term (current) use of oral hypoglycemic drugs: Secondary | ICD-10-CM | POA: Diagnosis not present

## 2016-07-19 DIAGNOSIS — I509 Heart failure, unspecified: Secondary | ICD-10-CM | POA: Diagnosis not present

## 2016-07-19 DIAGNOSIS — I130 Hypertensive heart and chronic kidney disease with heart failure and stage 1 through stage 4 chronic kidney disease, or unspecified chronic kidney disease: Secondary | ICD-10-CM | POA: Diagnosis not present

## 2016-07-19 DIAGNOSIS — N183 Chronic kidney disease, stage 3 (moderate): Secondary | ICD-10-CM | POA: Diagnosis not present

## 2016-07-19 DIAGNOSIS — Z9181 History of falling: Secondary | ICD-10-CM | POA: Diagnosis not present

## 2016-07-19 DIAGNOSIS — E1122 Type 2 diabetes mellitus with diabetic chronic kidney disease: Secondary | ICD-10-CM | POA: Diagnosis not present

## 2016-07-19 LAB — CULTURE, BLOOD (ROUTINE X 2)
CULTURE: NO GROWTH
Culture: NO GROWTH
SPECIAL REQUESTS: ADEQUATE

## 2016-07-19 NOTE — Consult Note (Signed)
           [facility] Primary Care Navigator  07/19/2016  Tyler PitaMary S Tabb 1946/02/15 [id number]   Went to see patient at the bedside to identify possible discharge needs but she was alreadydischarged.  Patient was discharged home with home health services (PT, OT, RN, aide and social worker).  PCP office ([facility] Ket[facility]t [facility] called and confirmedthat patient is currently seen by this provider.  Primary care provider's office called and notified Wilkie Aye(Kristy) of patient's discharge and need for post hospital follow-up and transition of care. Notified also of patient's health needs/issues needing follow-up.   Made aware to [facility] if deemed appropriate for services.  For questions, please contact:  Wyatt HasteLorraine Merrick Maggio, BSN, RN- [facility] Primary Care Navigator  Telephone: [phone] [facility]

## 2016-07-20 DIAGNOSIS — M8008XA Age-related osteoporosis with current pathological fracture, vertebra(e), initial encounter for fracture: Secondary | ICD-10-CM | POA: Diagnosis not present

## 2016-07-20 DIAGNOSIS — M4726 Other spondylosis with radiculopathy, lumbar region: Secondary | ICD-10-CM | POA: Diagnosis not present

## 2016-07-20 DIAGNOSIS — M5116 Intervertebral disc disorders with radiculopathy, lumbar region: Secondary | ICD-10-CM | POA: Diagnosis not present

## 2016-07-22 DIAGNOSIS — M5116 Intervertebral disc disorders with radiculopathy, lumbar region: Secondary | ICD-10-CM | POA: Diagnosis not present

## 2016-07-22 DIAGNOSIS — Z01818 Encounter for other preprocedural examination: Secondary | ICD-10-CM | POA: Diagnosis not present

## 2016-07-23 DIAGNOSIS — E1122 Type 2 diabetes mellitus with diabetic chronic kidney disease: Secondary | ICD-10-CM | POA: Diagnosis not present

## 2016-07-23 DIAGNOSIS — Z7984 Long term (current) use of oral hypoglycemic drugs: Secondary | ICD-10-CM | POA: Diagnosis not present

## 2016-07-23 DIAGNOSIS — I130 Hypertensive heart and chronic kidney disease with heart failure and stage 1 through stage 4 chronic kidney disease, or unspecified chronic kidney disease: Secondary | ICD-10-CM | POA: Diagnosis not present

## 2016-07-23 DIAGNOSIS — E1165 Type 2 diabetes mellitus with hyperglycemia: Secondary | ICD-10-CM | POA: Diagnosis not present

## 2016-07-23 DIAGNOSIS — I509 Heart failure, unspecified: Secondary | ICD-10-CM | POA: Diagnosis not present

## 2016-07-23 DIAGNOSIS — F329 Major depressive disorder, single episode, unspecified: Secondary | ICD-10-CM | POA: Diagnosis not present

## 2016-07-23 DIAGNOSIS — Z9181 History of falling: Secondary | ICD-10-CM | POA: Diagnosis not present

## 2016-07-23 DIAGNOSIS — N183 Chronic kidney disease, stage 3 (moderate): Secondary | ICD-10-CM | POA: Diagnosis not present

## 2016-07-27 DIAGNOSIS — Z9181 History of falling: Secondary | ICD-10-CM | POA: Diagnosis not present

## 2016-07-27 DIAGNOSIS — E1165 Type 2 diabetes mellitus with hyperglycemia: Secondary | ICD-10-CM | POA: Diagnosis not present

## 2016-07-27 DIAGNOSIS — N183 Chronic kidney disease, stage 3 (moderate): Secondary | ICD-10-CM | POA: Diagnosis not present

## 2016-07-27 DIAGNOSIS — Z7984 Long term (current) use of oral hypoglycemic drugs: Secondary | ICD-10-CM | POA: Diagnosis not present

## 2016-07-27 DIAGNOSIS — E1122 Type 2 diabetes mellitus with diabetic chronic kidney disease: Secondary | ICD-10-CM | POA: Diagnosis not present

## 2016-07-27 DIAGNOSIS — F329 Major depressive disorder, single episode, unspecified: Secondary | ICD-10-CM | POA: Diagnosis not present

## 2016-07-27 DIAGNOSIS — I509 Heart failure, unspecified: Secondary | ICD-10-CM | POA: Diagnosis not present

## 2016-07-27 DIAGNOSIS — I130 Hypertensive heart and chronic kidney disease with heart failure and stage 1 through stage 4 chronic kidney disease, or unspecified chronic kidney disease: Secondary | ICD-10-CM | POA: Diagnosis not present

## 2016-07-28 DIAGNOSIS — E785 Hyperlipidemia, unspecified: Secondary | ICD-10-CM | POA: Diagnosis not present

## 2016-07-28 DIAGNOSIS — I878 Other specified disorders of veins: Secondary | ICD-10-CM | POA: Diagnosis not present

## 2016-07-28 DIAGNOSIS — S32029A Unspecified fracture of second lumbar vertebra, initial encounter for closed fracture: Secondary | ICD-10-CM | POA: Diagnosis not present

## 2016-07-28 DIAGNOSIS — M8008XA Age-related osteoporosis with current pathological fracture, vertebra(e), initial encounter for fracture: Secondary | ICD-10-CM | POA: Diagnosis not present

## 2016-07-28 DIAGNOSIS — Z4889 Encounter for other specified surgical aftercare: Secondary | ICD-10-CM | POA: Diagnosis not present

## 2016-07-28 DIAGNOSIS — N183 Chronic kidney disease, stage 3 (moderate): Secondary | ICD-10-CM | POA: Diagnosis not present

## 2016-07-28 DIAGNOSIS — E11319 Type 2 diabetes mellitus with unspecified diabetic retinopathy without macular edema: Secondary | ICD-10-CM | POA: Diagnosis not present

## 2016-07-28 DIAGNOSIS — I130 Hypertensive heart and chronic kidney disease with heart failure and stage 1 through stage 4 chronic kidney disease, or unspecified chronic kidney disease: Secondary | ICD-10-CM | POA: Diagnosis not present

## 2016-07-28 DIAGNOSIS — M4726 Other spondylosis with radiculopathy, lumbar region: Secondary | ICD-10-CM | POA: Diagnosis not present

## 2016-07-28 DIAGNOSIS — M5116 Intervertebral disc disorders with radiculopathy, lumbar region: Secondary | ICD-10-CM | POA: Diagnosis not present

## 2016-07-28 DIAGNOSIS — E1122 Type 2 diabetes mellitus with diabetic chronic kidney disease: Secondary | ICD-10-CM | POA: Diagnosis not present

## 2016-07-29 DIAGNOSIS — I878 Other specified disorders of veins: Secondary | ICD-10-CM | POA: Diagnosis not present

## 2016-07-29 DIAGNOSIS — M4726 Other spondylosis with radiculopathy, lumbar region: Secondary | ICD-10-CM | POA: Diagnosis not present

## 2016-07-29 DIAGNOSIS — E785 Hyperlipidemia, unspecified: Secondary | ICD-10-CM | POA: Diagnosis not present

## 2016-07-29 DIAGNOSIS — E1122 Type 2 diabetes mellitus with diabetic chronic kidney disease: Secondary | ICD-10-CM | POA: Diagnosis not present

## 2016-07-29 DIAGNOSIS — I130 Hypertensive heart and chronic kidney disease with heart failure and stage 1 through stage 4 chronic kidney disease, or unspecified chronic kidney disease: Secondary | ICD-10-CM | POA: Diagnosis not present

## 2016-07-29 DIAGNOSIS — N183 Chronic kidney disease, stage 3 (moderate): Secondary | ICD-10-CM | POA: Diagnosis not present

## 2016-07-29 DIAGNOSIS — M8008XA Age-related osteoporosis with current pathological fracture, vertebra(e), initial encounter for fracture: Secondary | ICD-10-CM | POA: Diagnosis not present

## 2016-07-29 DIAGNOSIS — E11319 Type 2 diabetes mellitus with unspecified diabetic retinopathy without macular edema: Secondary | ICD-10-CM | POA: Diagnosis not present

## 2016-07-29 DIAGNOSIS — S32029A Unspecified fracture of second lumbar vertebra, initial encounter for closed fracture: Secondary | ICD-10-CM | POA: Diagnosis not present

## 2016-07-30 DIAGNOSIS — N183 Chronic kidney disease, stage 3 (moderate): Secondary | ICD-10-CM | POA: Diagnosis not present

## 2016-07-30 DIAGNOSIS — I130 Hypertensive heart and chronic kidney disease with heart failure and stage 1 through stage 4 chronic kidney disease, or unspecified chronic kidney disease: Secondary | ICD-10-CM | POA: Diagnosis not present

## 2016-07-30 DIAGNOSIS — E1165 Type 2 diabetes mellitus with hyperglycemia: Secondary | ICD-10-CM | POA: Diagnosis not present

## 2016-07-30 DIAGNOSIS — Z9181 History of falling: Secondary | ICD-10-CM | POA: Diagnosis not present

## 2016-07-30 DIAGNOSIS — I509 Heart failure, unspecified: Secondary | ICD-10-CM | POA: Diagnosis not present

## 2016-07-30 DIAGNOSIS — F329 Major depressive disorder, single episode, unspecified: Secondary | ICD-10-CM | POA: Diagnosis not present

## 2016-07-30 DIAGNOSIS — Z7984 Long term (current) use of oral hypoglycemic drugs: Secondary | ICD-10-CM | POA: Diagnosis not present

## 2016-07-30 DIAGNOSIS — E1122 Type 2 diabetes mellitus with diabetic chronic kidney disease: Secondary | ICD-10-CM | POA: Diagnosis not present

## 2016-08-03 DIAGNOSIS — I509 Heart failure, unspecified: Secondary | ICD-10-CM | POA: Diagnosis not present

## 2016-08-03 DIAGNOSIS — F329 Major depressive disorder, single episode, unspecified: Secondary | ICD-10-CM | POA: Diagnosis not present

## 2016-08-03 DIAGNOSIS — Z9181 History of falling: Secondary | ICD-10-CM | POA: Diagnosis not present

## 2016-08-03 DIAGNOSIS — Z7984 Long term (current) use of oral hypoglycemic drugs: Secondary | ICD-10-CM | POA: Diagnosis not present

## 2016-08-03 DIAGNOSIS — E1165 Type 2 diabetes mellitus with hyperglycemia: Secondary | ICD-10-CM | POA: Diagnosis not present

## 2016-08-03 DIAGNOSIS — I130 Hypertensive heart and chronic kidney disease with heart failure and stage 1 through stage 4 chronic kidney disease, or unspecified chronic kidney disease: Secondary | ICD-10-CM | POA: Diagnosis not present

## 2016-08-03 DIAGNOSIS — N183 Chronic kidney disease, stage 3 (moderate): Secondary | ICD-10-CM | POA: Diagnosis not present

## 2016-08-03 DIAGNOSIS — E1122 Type 2 diabetes mellitus with diabetic chronic kidney disease: Secondary | ICD-10-CM | POA: Diagnosis not present

## 2016-08-04 DIAGNOSIS — E1122 Type 2 diabetes mellitus with diabetic chronic kidney disease: Secondary | ICD-10-CM | POA: Diagnosis not present

## 2016-08-04 DIAGNOSIS — I509 Heart failure, unspecified: Secondary | ICD-10-CM | POA: Diagnosis not present

## 2016-08-04 DIAGNOSIS — Z9181 History of falling: Secondary | ICD-10-CM | POA: Diagnosis not present

## 2016-08-04 DIAGNOSIS — Z7984 Long term (current) use of oral hypoglycemic drugs: Secondary | ICD-10-CM | POA: Diagnosis not present

## 2016-08-04 DIAGNOSIS — N183 Chronic kidney disease, stage 3 (moderate): Secondary | ICD-10-CM | POA: Diagnosis not present

## 2016-08-04 DIAGNOSIS — I130 Hypertensive heart and chronic kidney disease with heart failure and stage 1 through stage 4 chronic kidney disease, or unspecified chronic kidney disease: Secondary | ICD-10-CM | POA: Diagnosis not present

## 2016-08-04 DIAGNOSIS — F329 Major depressive disorder, single episode, unspecified: Secondary | ICD-10-CM | POA: Diagnosis not present

## 2016-08-04 DIAGNOSIS — E1165 Type 2 diabetes mellitus with hyperglycemia: Secondary | ICD-10-CM | POA: Diagnosis not present

## 2016-08-05 DIAGNOSIS — E1165 Type 2 diabetes mellitus with hyperglycemia: Secondary | ICD-10-CM | POA: Diagnosis not present

## 2016-08-05 DIAGNOSIS — I509 Heart failure, unspecified: Secondary | ICD-10-CM | POA: Diagnosis not present

## 2016-08-05 DIAGNOSIS — Z7984 Long term (current) use of oral hypoglycemic drugs: Secondary | ICD-10-CM | POA: Diagnosis not present

## 2016-08-05 DIAGNOSIS — N183 Chronic kidney disease, stage 3 (moderate): Secondary | ICD-10-CM | POA: Diagnosis not present

## 2016-08-05 DIAGNOSIS — I130 Hypertensive heart and chronic kidney disease with heart failure and stage 1 through stage 4 chronic kidney disease, or unspecified chronic kidney disease: Secondary | ICD-10-CM | POA: Diagnosis not present

## 2016-08-05 DIAGNOSIS — E1122 Type 2 diabetes mellitus with diabetic chronic kidney disease: Secondary | ICD-10-CM | POA: Diagnosis not present

## 2016-08-05 DIAGNOSIS — F329 Major depressive disorder, single episode, unspecified: Secondary | ICD-10-CM | POA: Diagnosis not present

## 2016-08-05 DIAGNOSIS — Z9181 History of falling: Secondary | ICD-10-CM | POA: Diagnosis not present

## 2016-08-06 DIAGNOSIS — E1165 Type 2 diabetes mellitus with hyperglycemia: Secondary | ICD-10-CM | POA: Diagnosis not present

## 2016-08-06 DIAGNOSIS — Z9181 History of falling: Secondary | ICD-10-CM | POA: Diagnosis not present

## 2016-08-06 DIAGNOSIS — N183 Chronic kidney disease, stage 3 (moderate): Secondary | ICD-10-CM | POA: Diagnosis not present

## 2016-08-06 DIAGNOSIS — E1122 Type 2 diabetes mellitus with diabetic chronic kidney disease: Secondary | ICD-10-CM | POA: Diagnosis not present

## 2016-08-06 DIAGNOSIS — I130 Hypertensive heart and chronic kidney disease with heart failure and stage 1 through stage 4 chronic kidney disease, or unspecified chronic kidney disease: Secondary | ICD-10-CM | POA: Diagnosis not present

## 2016-08-06 DIAGNOSIS — I509 Heart failure, unspecified: Secondary | ICD-10-CM | POA: Diagnosis not present

## 2016-08-06 DIAGNOSIS — F329 Major depressive disorder, single episode, unspecified: Secondary | ICD-10-CM | POA: Diagnosis not present

## 2016-08-06 DIAGNOSIS — Z7984 Long term (current) use of oral hypoglycemic drugs: Secondary | ICD-10-CM | POA: Diagnosis not present

## 2016-08-11 DIAGNOSIS — E785 Hyperlipidemia, unspecified: Secondary | ICD-10-CM | POA: Diagnosis not present

## 2016-08-11 DIAGNOSIS — M545 Low back pain: Secondary | ICD-10-CM | POA: Diagnosis not present

## 2016-08-11 DIAGNOSIS — R102 Pelvic and perineal pain: Secondary | ICD-10-CM | POA: Diagnosis not present

## 2016-08-11 DIAGNOSIS — F329 Major depressive disorder, single episode, unspecified: Secondary | ICD-10-CM | POA: Diagnosis not present

## 2016-08-11 DIAGNOSIS — I130 Hypertensive heart and chronic kidney disease with heart failure and stage 1 through stage 4 chronic kidney disease, or unspecified chronic kidney disease: Secondary | ICD-10-CM | POA: Diagnosis not present

## 2016-08-11 DIAGNOSIS — I509 Heart failure, unspecified: Secondary | ICD-10-CM | POA: Diagnosis not present

## 2016-08-11 DIAGNOSIS — R600 Localized edema: Secondary | ICD-10-CM | POA: Diagnosis not present

## 2016-08-11 DIAGNOSIS — R197 Diarrhea, unspecified: Secondary | ICD-10-CM | POA: Diagnosis not present

## 2016-08-11 DIAGNOSIS — M79606 Pain in leg, unspecified: Secondary | ICD-10-CM | POA: Diagnosis not present

## 2016-08-11 DIAGNOSIS — N183 Chronic kidney disease, stage 3 (moderate): Secondary | ICD-10-CM | POA: Diagnosis not present

## 2016-08-11 DIAGNOSIS — F419 Anxiety disorder, unspecified: Secondary | ICD-10-CM | POA: Diagnosis not present

## 2016-08-11 DIAGNOSIS — M5489 Other dorsalgia: Secondary | ICD-10-CM | POA: Diagnosis not present

## 2016-08-11 DIAGNOSIS — E1122 Type 2 diabetes mellitus with diabetic chronic kidney disease: Secondary | ICD-10-CM | POA: Diagnosis not present

## 2016-08-12 DIAGNOSIS — F329 Major depressive disorder, single episode, unspecified: Secondary | ICD-10-CM | POA: Diagnosis not present

## 2016-08-12 DIAGNOSIS — E1122 Type 2 diabetes mellitus with diabetic chronic kidney disease: Secondary | ICD-10-CM | POA: Diagnosis not present

## 2016-08-12 DIAGNOSIS — Z9181 History of falling: Secondary | ICD-10-CM | POA: Diagnosis not present

## 2016-08-12 DIAGNOSIS — E1165 Type 2 diabetes mellitus with hyperglycemia: Secondary | ICD-10-CM | POA: Diagnosis not present

## 2016-08-12 DIAGNOSIS — N183 Chronic kidney disease, stage 3 (moderate): Secondary | ICD-10-CM | POA: Diagnosis not present

## 2016-08-12 DIAGNOSIS — I130 Hypertensive heart and chronic kidney disease with heart failure and stage 1 through stage 4 chronic kidney disease, or unspecified chronic kidney disease: Secondary | ICD-10-CM | POA: Diagnosis not present

## 2016-08-12 DIAGNOSIS — Z7984 Long term (current) use of oral hypoglycemic drugs: Secondary | ICD-10-CM | POA: Diagnosis not present

## 2016-08-12 DIAGNOSIS — I509 Heart failure, unspecified: Secondary | ICD-10-CM | POA: Diagnosis not present

## 2016-08-13 DIAGNOSIS — N183 Chronic kidney disease, stage 3 (moderate): Secondary | ICD-10-CM | POA: Diagnosis not present

## 2016-08-13 DIAGNOSIS — I130 Hypertensive heart and chronic kidney disease with heart failure and stage 1 through stage 4 chronic kidney disease, or unspecified chronic kidney disease: Secondary | ICD-10-CM | POA: Diagnosis not present

## 2016-08-13 DIAGNOSIS — Z9181 History of falling: Secondary | ICD-10-CM | POA: Diagnosis not present

## 2016-08-13 DIAGNOSIS — Z7984 Long term (current) use of oral hypoglycemic drugs: Secondary | ICD-10-CM | POA: Diagnosis not present

## 2016-08-13 DIAGNOSIS — E1122 Type 2 diabetes mellitus with diabetic chronic kidney disease: Secondary | ICD-10-CM | POA: Diagnosis not present

## 2016-08-13 DIAGNOSIS — E1165 Type 2 diabetes mellitus with hyperglycemia: Secondary | ICD-10-CM | POA: Diagnosis not present

## 2016-08-13 DIAGNOSIS — F329 Major depressive disorder, single episode, unspecified: Secondary | ICD-10-CM | POA: Diagnosis not present

## 2016-08-13 DIAGNOSIS — I509 Heart failure, unspecified: Secondary | ICD-10-CM | POA: Diagnosis not present

## 2016-08-17 DIAGNOSIS — Z7984 Long term (current) use of oral hypoglycemic drugs: Secondary | ICD-10-CM | POA: Diagnosis not present

## 2016-08-17 DIAGNOSIS — F329 Major depressive disorder, single episode, unspecified: Secondary | ICD-10-CM | POA: Diagnosis not present

## 2016-08-17 DIAGNOSIS — Z9181 History of falling: Secondary | ICD-10-CM | POA: Diagnosis not present

## 2016-08-17 DIAGNOSIS — I509 Heart failure, unspecified: Secondary | ICD-10-CM | POA: Diagnosis not present

## 2016-08-17 DIAGNOSIS — E1165 Type 2 diabetes mellitus with hyperglycemia: Secondary | ICD-10-CM | POA: Diagnosis not present

## 2016-08-17 DIAGNOSIS — I130 Hypertensive heart and chronic kidney disease with heart failure and stage 1 through stage 4 chronic kidney disease, or unspecified chronic kidney disease: Secondary | ICD-10-CM | POA: Diagnosis not present

## 2016-08-17 DIAGNOSIS — E1122 Type 2 diabetes mellitus with diabetic chronic kidney disease: Secondary | ICD-10-CM | POA: Diagnosis not present

## 2016-08-17 DIAGNOSIS — N183 Chronic kidney disease, stage 3 (moderate): Secondary | ICD-10-CM | POA: Diagnosis not present

## 2016-08-18 DIAGNOSIS — E1165 Type 2 diabetes mellitus with hyperglycemia: Secondary | ICD-10-CM | POA: Diagnosis not present

## 2016-08-18 DIAGNOSIS — N183 Chronic kidney disease, stage 3 (moderate): Secondary | ICD-10-CM | POA: Diagnosis not present

## 2016-08-18 DIAGNOSIS — F329 Major depressive disorder, single episode, unspecified: Secondary | ICD-10-CM | POA: Diagnosis not present

## 2016-08-18 DIAGNOSIS — Z9181 History of falling: Secondary | ICD-10-CM | POA: Diagnosis not present

## 2016-08-18 DIAGNOSIS — E1122 Type 2 diabetes mellitus with diabetic chronic kidney disease: Secondary | ICD-10-CM | POA: Diagnosis not present

## 2016-08-18 DIAGNOSIS — I509 Heart failure, unspecified: Secondary | ICD-10-CM | POA: Diagnosis not present

## 2016-08-18 DIAGNOSIS — I130 Hypertensive heart and chronic kidney disease with heart failure and stage 1 through stage 4 chronic kidney disease, or unspecified chronic kidney disease: Secondary | ICD-10-CM | POA: Diagnosis not present

## 2016-08-18 DIAGNOSIS — Z7984 Long term (current) use of oral hypoglycemic drugs: Secondary | ICD-10-CM | POA: Diagnosis not present

## 2016-08-19 DIAGNOSIS — E039 Hypothyroidism, unspecified: Secondary | ICD-10-CM | POA: Diagnosis not present

## 2016-08-19 DIAGNOSIS — E559 Vitamin D deficiency, unspecified: Secondary | ICD-10-CM | POA: Diagnosis not present

## 2016-08-19 DIAGNOSIS — E1165 Type 2 diabetes mellitus with hyperglycemia: Secondary | ICD-10-CM | POA: Diagnosis not present

## 2016-08-19 DIAGNOSIS — E782 Mixed hyperlipidemia: Secondary | ICD-10-CM | POA: Diagnosis not present

## 2016-08-19 DIAGNOSIS — R5383 Other fatigue: Secondary | ICD-10-CM | POA: Diagnosis not present

## 2016-08-19 DIAGNOSIS — I10 Essential (primary) hypertension: Secondary | ICD-10-CM | POA: Diagnosis not present

## 2016-08-19 DIAGNOSIS — M5416 Radiculopathy, lumbar region: Secondary | ICD-10-CM | POA: Diagnosis not present

## 2016-08-27 DIAGNOSIS — M4726 Other spondylosis with radiculopathy, lumbar region: Secondary | ICD-10-CM | POA: Diagnosis not present

## 2018-01-25 DIAGNOSIS — R531 Weakness: Secondary | ICD-10-CM

## 2018-01-25 DIAGNOSIS — R319 Hematuria, unspecified: Secondary | ICD-10-CM | POA: Diagnosis not present

## 2018-01-25 DIAGNOSIS — F419 Anxiety disorder, unspecified: Secondary | ICD-10-CM

## 2018-01-25 DIAGNOSIS — M352 Behcet's disease: Secondary | ICD-10-CM

## 2018-01-25 DIAGNOSIS — E871 Hypo-osmolality and hyponatremia: Secondary | ICD-10-CM

## 2018-01-25 DIAGNOSIS — R197 Diarrhea, unspecified: Secondary | ICD-10-CM

## 2018-01-25 DIAGNOSIS — D72825 Bandemia: Secondary | ICD-10-CM

## 2018-01-26 DIAGNOSIS — R319 Hematuria, unspecified: Secondary | ICD-10-CM | POA: Diagnosis not present

## 2018-01-26 DIAGNOSIS — D72825 Bandemia: Secondary | ICD-10-CM | POA: Diagnosis not present

## 2018-01-26 DIAGNOSIS — R531 Weakness: Secondary | ICD-10-CM | POA: Diagnosis not present

## 2018-01-26 DIAGNOSIS — R197 Diarrhea, unspecified: Secondary | ICD-10-CM | POA: Diagnosis not present

## 2018-01-27 DIAGNOSIS — R319 Hematuria, unspecified: Secondary | ICD-10-CM | POA: Diagnosis not present

## 2018-01-27 DIAGNOSIS — D72825 Bandemia: Secondary | ICD-10-CM | POA: Diagnosis not present

## 2018-01-27 DIAGNOSIS — R197 Diarrhea, unspecified: Secondary | ICD-10-CM | POA: Diagnosis not present

## 2018-01-27 DIAGNOSIS — R531 Weakness: Secondary | ICD-10-CM | POA: Diagnosis not present

## 2018-02-08 DIAGNOSIS — E871 Hypo-osmolality and hyponatremia: Secondary | ICD-10-CM

## 2018-02-08 DIAGNOSIS — R112 Nausea with vomiting, unspecified: Secondary | ICD-10-CM

## 2018-02-08 DIAGNOSIS — E860 Dehydration: Secondary | ICD-10-CM

## 2018-02-08 DIAGNOSIS — E119 Type 2 diabetes mellitus without complications: Secondary | ICD-10-CM

## 2018-02-08 DIAGNOSIS — N179 Acute kidney failure, unspecified: Secondary | ICD-10-CM

## 2018-02-08 DIAGNOSIS — R197 Diarrhea, unspecified: Secondary | ICD-10-CM

## 2018-02-08 DIAGNOSIS — E039 Hypothyroidism, unspecified: Secondary | ICD-10-CM

## 2018-02-08 DIAGNOSIS — I10 Essential (primary) hypertension: Secondary | ICD-10-CM

## 2018-02-09 DIAGNOSIS — R112 Nausea with vomiting, unspecified: Secondary | ICD-10-CM | POA: Diagnosis not present

## 2018-02-09 DIAGNOSIS — E860 Dehydration: Secondary | ICD-10-CM | POA: Diagnosis not present

## 2018-02-09 DIAGNOSIS — E119 Type 2 diabetes mellitus without complications: Secondary | ICD-10-CM | POA: Diagnosis not present

## 2018-02-09 DIAGNOSIS — N179 Acute kidney failure, unspecified: Secondary | ICD-10-CM | POA: Diagnosis not present

## 2018-03-06 IMAGING — DX DG CHEST 1V PORT
1 series · 1 of 1 positions shown · non-contrast
Comparison: None.

CLINICAL DATA: Sepsis

EXAM:
PORTABLE CHEST 1 VIEW

[chest ap]
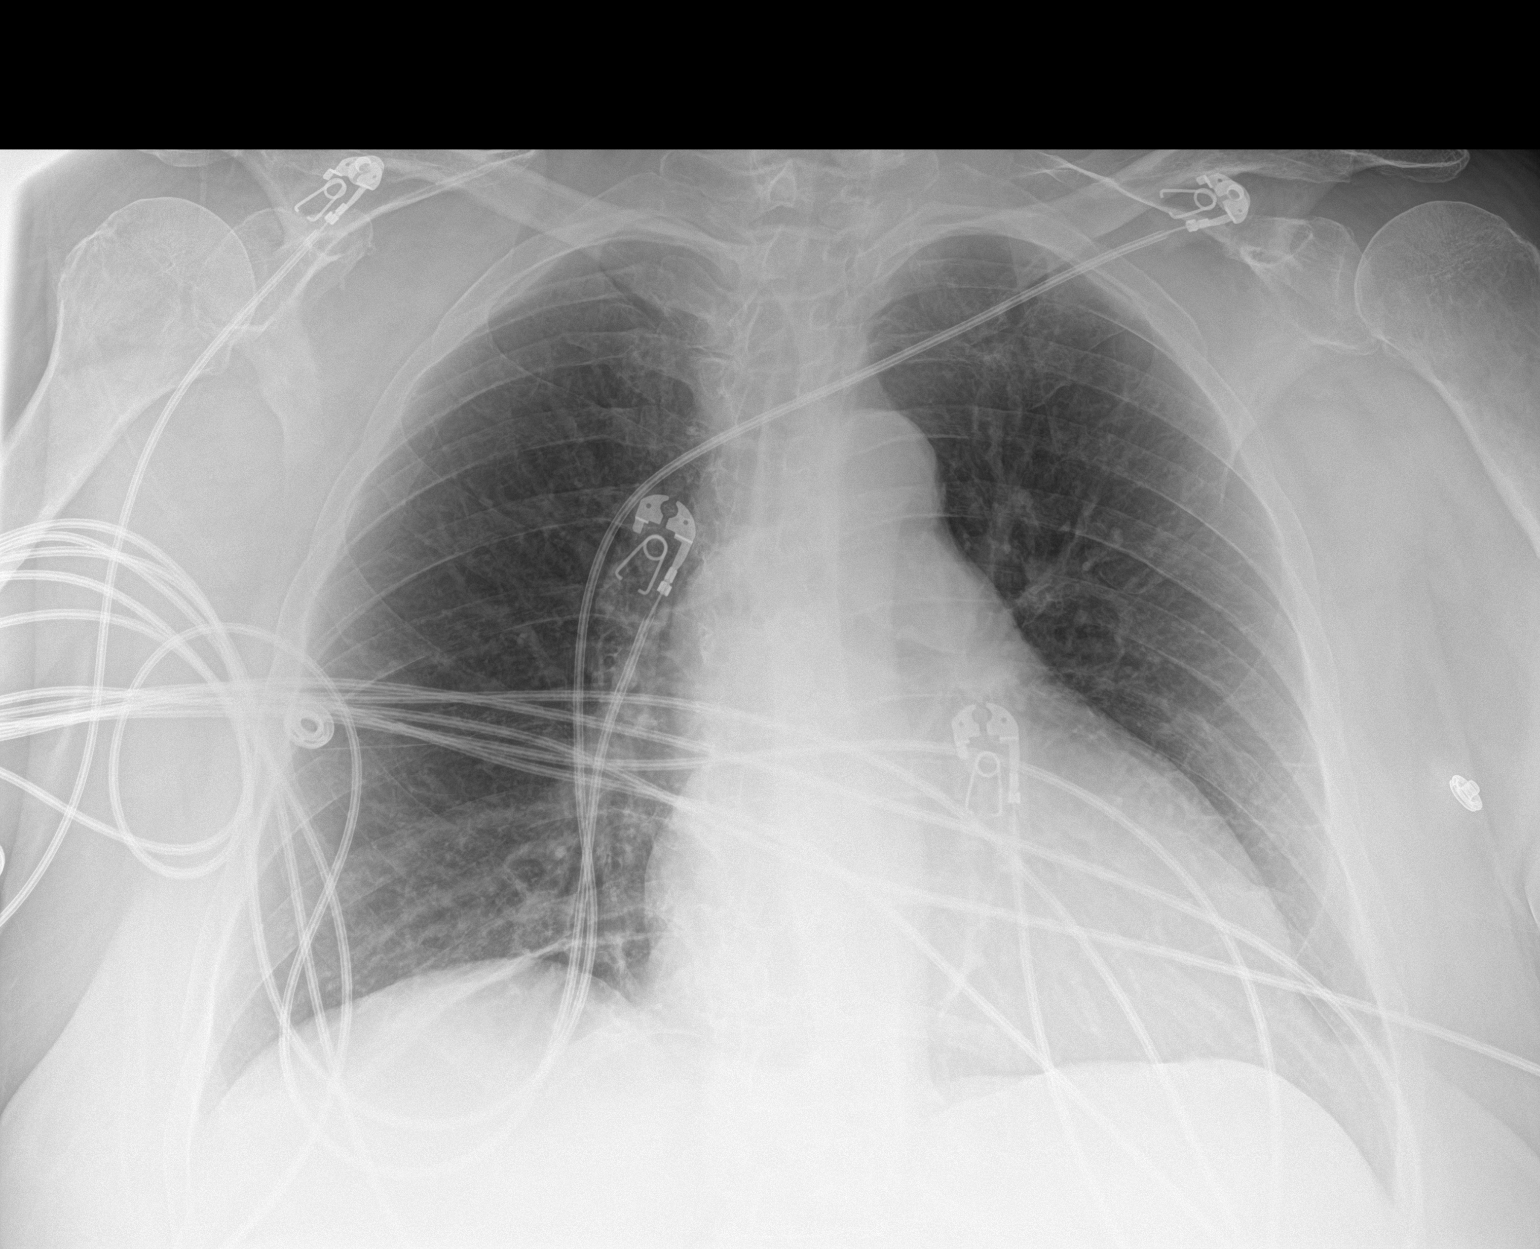

[1 of 1 positions shown; findings below may reference images not displayed]

FINDINGS: The heart size and mediastinal contours are within normal limits.
Both lungs are clear. The visualized skeletal structures are
unremarkable.
IMPRESSION: No active disease.

## 2018-03-08 IMAGING — DX DG HIP (WITH OR WITHOUT PELVIS) 2-3V*L*
3 series · 3 of 3 positions shown · non-contrast
Comparison: None.

CLINICAL DATA: Multiple falls, left groin pain

EXAM:
DG HIP (WITH OR WITHOUT PELVIS) 2-3V LEFT

[pelvis ap (1 of 2)]
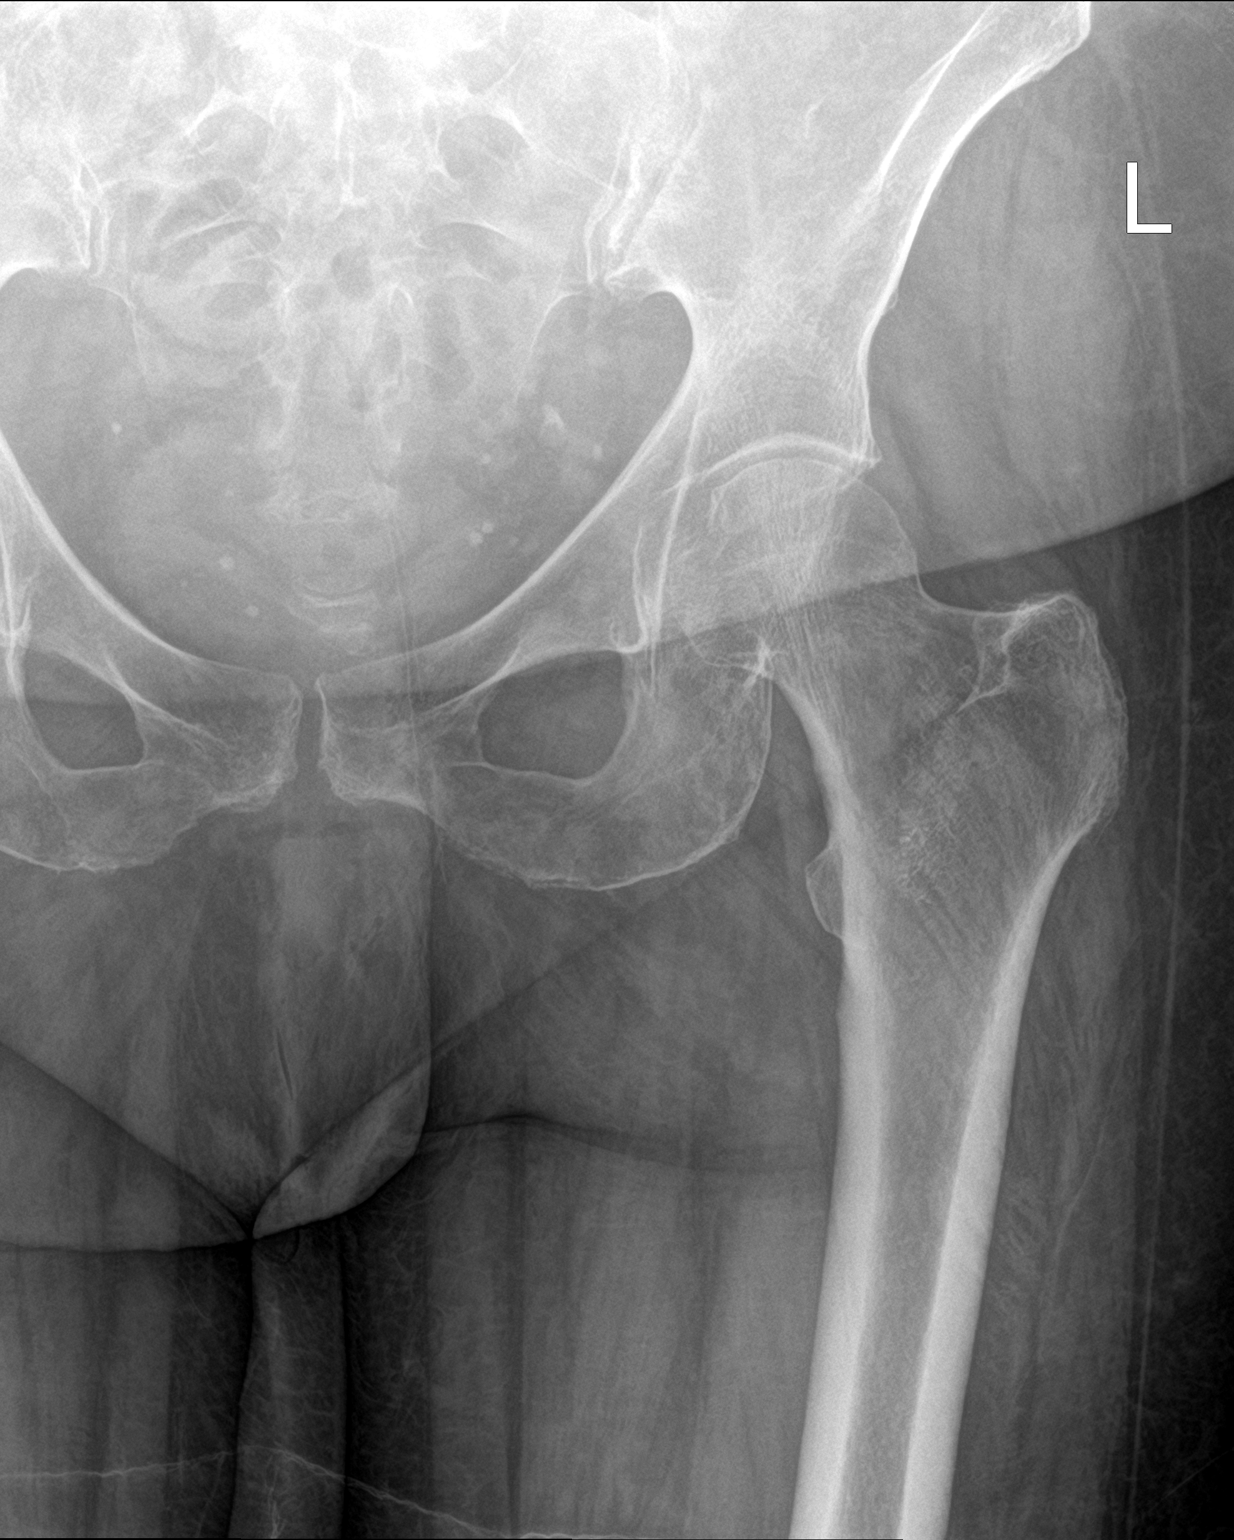

[hip lat]
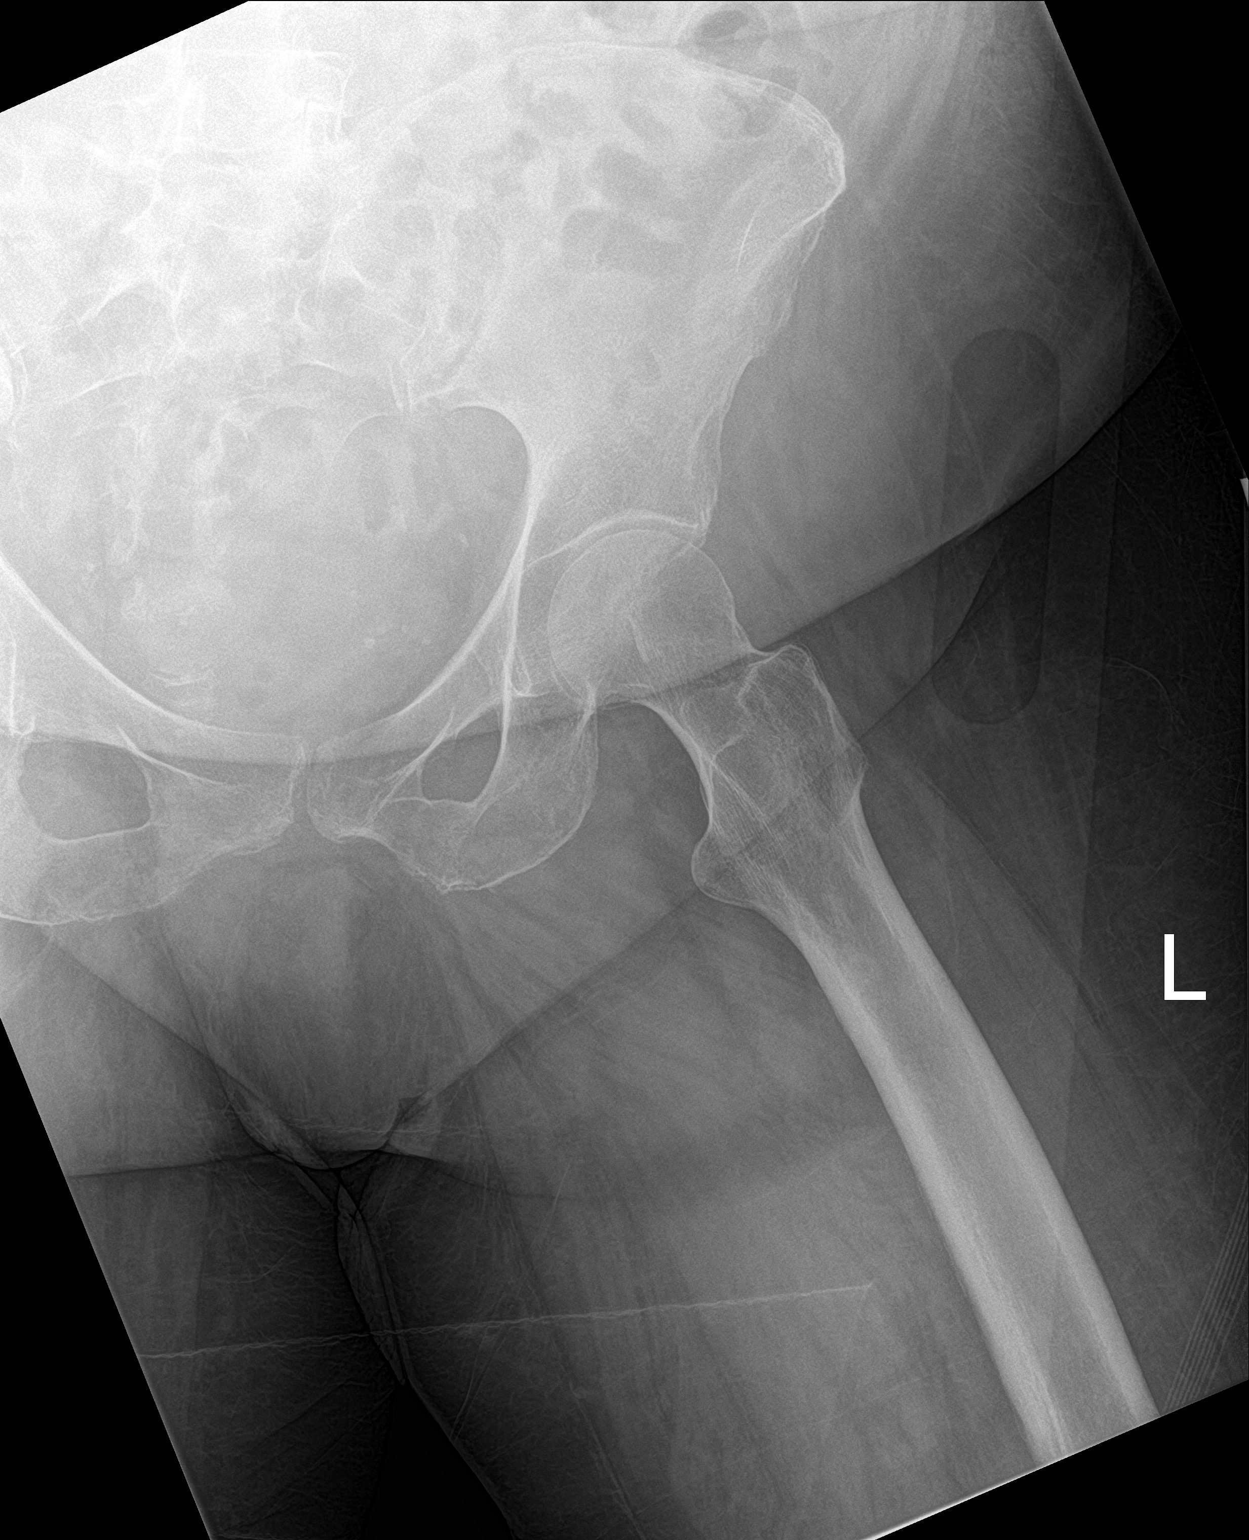

[pelvis ap (2 of 2)]
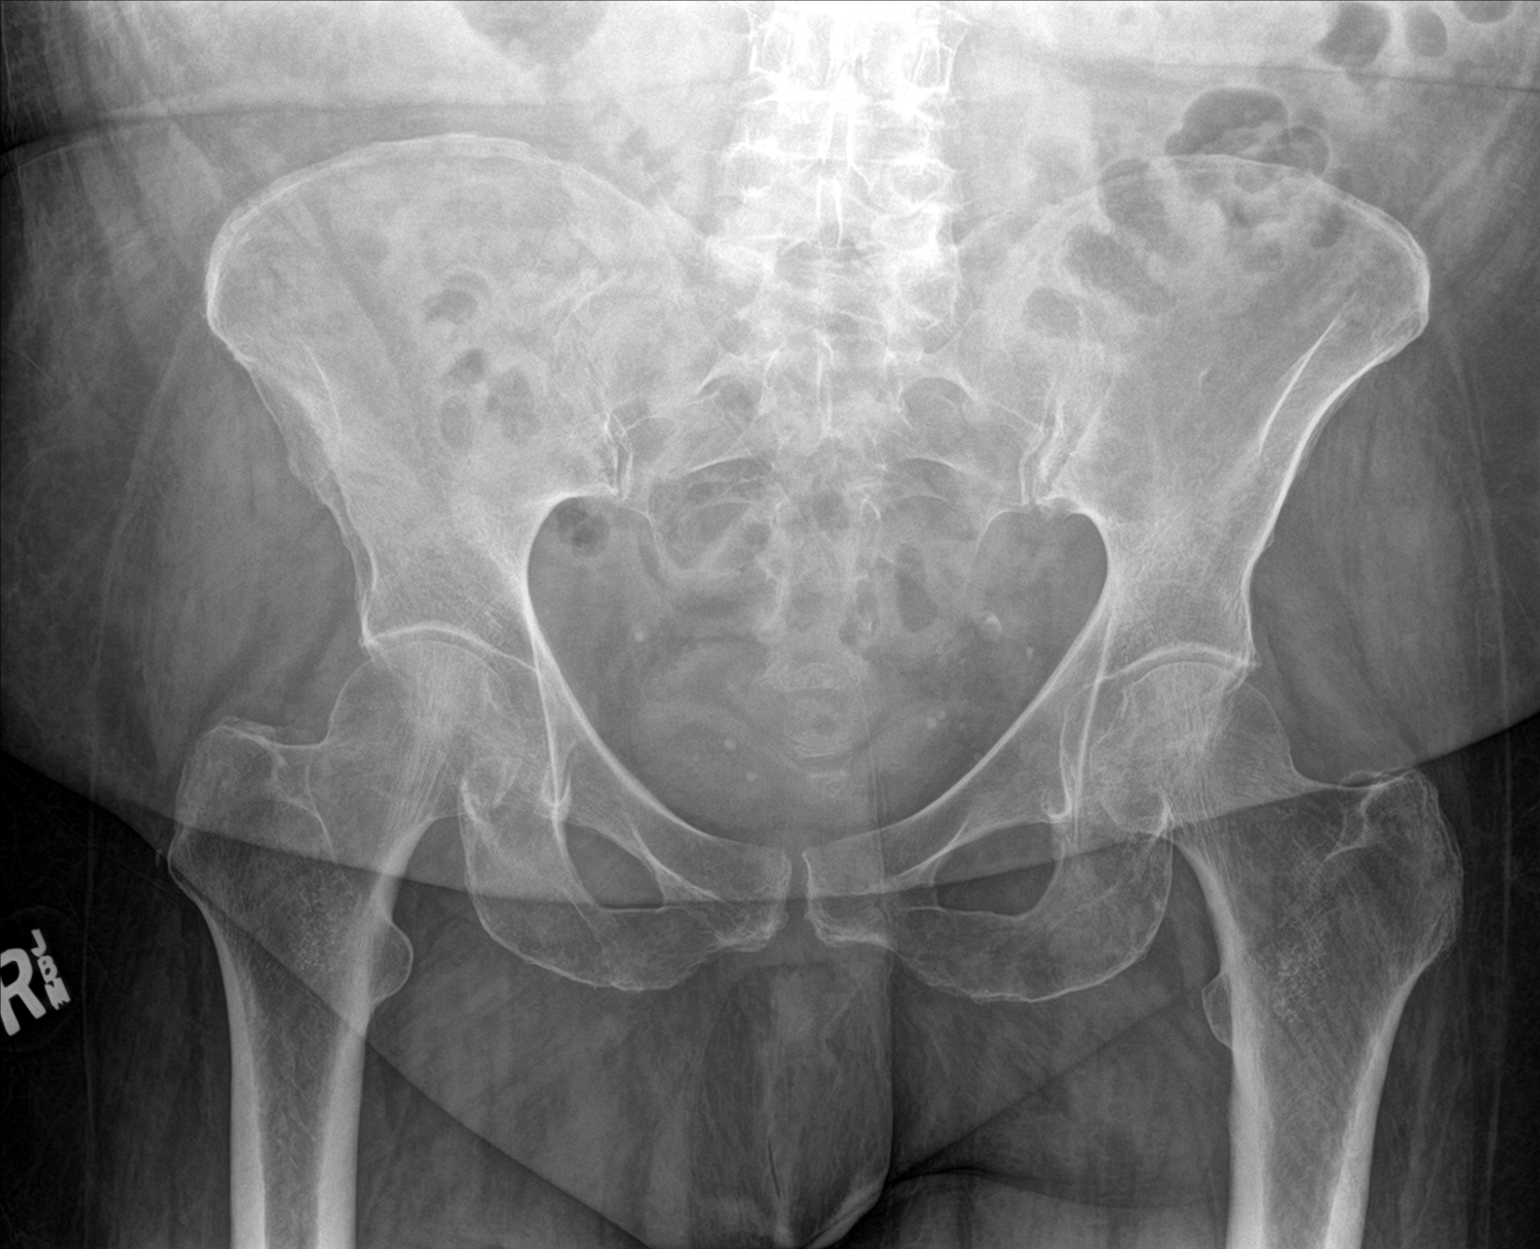

[3 of 3 positions shown; findings below may reference images not displayed]

FINDINGS: No fracture or dislocation is seen.

Bilateral hip joint spaces are preserved.

Visualized bony pelvis appears intact.

Degenerative changes of the lower lumbar spine.
IMPRESSION: Negative.

## 2019-01-17 DIAGNOSIS — N289 Disorder of kidney and ureter, unspecified: Secondary | ICD-10-CM

## 2019-01-17 DIAGNOSIS — S72142A Displaced intertrochanteric fracture of left femur, initial encounter for closed fracture: Secondary | ICD-10-CM | POA: Diagnosis not present

## 2019-01-17 DIAGNOSIS — E039 Hypothyroidism, unspecified: Secondary | ICD-10-CM | POA: Diagnosis not present

## 2019-01-17 DIAGNOSIS — G2581 Restless legs syndrome: Secondary | ICD-10-CM

## 2019-01-17 DIAGNOSIS — E119 Type 2 diabetes mellitus without complications: Secondary | ICD-10-CM | POA: Diagnosis not present

## 2019-01-18 DIAGNOSIS — D500 Iron deficiency anemia secondary to blood loss (chronic): Secondary | ICD-10-CM | POA: Diagnosis not present

## 2019-01-18 DIAGNOSIS — G2581 Restless legs syndrome: Secondary | ICD-10-CM | POA: Diagnosis not present

## 2019-01-18 DIAGNOSIS — E871 Hypo-osmolality and hyponatremia: Secondary | ICD-10-CM

## 2019-01-18 DIAGNOSIS — N179 Acute kidney failure, unspecified: Secondary | ICD-10-CM

## 2019-01-18 DIAGNOSIS — S72142A Displaced intertrochanteric fracture of left femur, initial encounter for closed fracture: Secondary | ICD-10-CM | POA: Diagnosis not present

## 2019-01-18 DIAGNOSIS — I5032 Chronic diastolic (congestive) heart failure: Secondary | ICD-10-CM

## 2019-01-18 DIAGNOSIS — F418 Other specified anxiety disorders: Secondary | ICD-10-CM

## 2019-01-18 DIAGNOSIS — I10 Essential (primary) hypertension: Secondary | ICD-10-CM

## 2019-01-19 DIAGNOSIS — D500 Iron deficiency anemia secondary to blood loss (chronic): Secondary | ICD-10-CM | POA: Diagnosis not present

## 2019-01-19 DIAGNOSIS — I5032 Chronic diastolic (congestive) heart failure: Secondary | ICD-10-CM | POA: Diagnosis not present

## 2019-01-19 DIAGNOSIS — G2581 Restless legs syndrome: Secondary | ICD-10-CM | POA: Diagnosis not present

## 2019-01-19 DIAGNOSIS — S72142A Displaced intertrochanteric fracture of left femur, initial encounter for closed fracture: Secondary | ICD-10-CM | POA: Diagnosis not present

## 2019-01-20 DIAGNOSIS — G2581 Restless legs syndrome: Secondary | ICD-10-CM | POA: Diagnosis not present

## 2019-01-20 DIAGNOSIS — S72142A Displaced intertrochanteric fracture of left femur, initial encounter for closed fracture: Secondary | ICD-10-CM | POA: Diagnosis not present

## 2019-01-20 DIAGNOSIS — I5032 Chronic diastolic (congestive) heart failure: Secondary | ICD-10-CM | POA: Diagnosis not present

## 2019-01-20 DIAGNOSIS — D500 Iron deficiency anemia secondary to blood loss (chronic): Secondary | ICD-10-CM | POA: Diagnosis not present

## 2019-01-21 DIAGNOSIS — G2581 Restless legs syndrome: Secondary | ICD-10-CM | POA: Diagnosis not present

## 2019-01-21 DIAGNOSIS — I5032 Chronic diastolic (congestive) heart failure: Secondary | ICD-10-CM | POA: Diagnosis not present

## 2019-01-21 DIAGNOSIS — S72142A Displaced intertrochanteric fracture of left femur, initial encounter for closed fracture: Secondary | ICD-10-CM | POA: Diagnosis not present

## 2019-01-21 DIAGNOSIS — D500 Iron deficiency anemia secondary to blood loss (chronic): Secondary | ICD-10-CM | POA: Diagnosis not present

## 2019-01-22 DIAGNOSIS — G2581 Restless legs syndrome: Secondary | ICD-10-CM | POA: Diagnosis not present

## 2019-01-22 DIAGNOSIS — S72142A Displaced intertrochanteric fracture of left femur, initial encounter for closed fracture: Secondary | ICD-10-CM | POA: Diagnosis not present

## 2019-01-22 DIAGNOSIS — D500 Iron deficiency anemia secondary to blood loss (chronic): Secondary | ICD-10-CM | POA: Diagnosis not present

## 2019-01-22 DIAGNOSIS — I5032 Chronic diastolic (congestive) heart failure: Secondary | ICD-10-CM | POA: Diagnosis not present

## 2019-01-23 DIAGNOSIS — I5032 Chronic diastolic (congestive) heart failure: Secondary | ICD-10-CM | POA: Diagnosis not present

## 2019-01-23 DIAGNOSIS — D500 Iron deficiency anemia secondary to blood loss (chronic): Secondary | ICD-10-CM | POA: Diagnosis not present

## 2019-01-23 DIAGNOSIS — S72142A Displaced intertrochanteric fracture of left femur, initial encounter for closed fracture: Secondary | ICD-10-CM | POA: Diagnosis not present

## 2019-01-23 DIAGNOSIS — G2581 Restless legs syndrome: Secondary | ICD-10-CM | POA: Diagnosis not present

## 2019-01-24 DIAGNOSIS — S72142A Displaced intertrochanteric fracture of left femur, initial encounter for closed fracture: Secondary | ICD-10-CM | POA: Diagnosis not present

## 2019-01-24 DIAGNOSIS — D500 Iron deficiency anemia secondary to blood loss (chronic): Secondary | ICD-10-CM | POA: Diagnosis not present

## 2019-01-24 DIAGNOSIS — G2581 Restless legs syndrome: Secondary | ICD-10-CM | POA: Diagnosis not present

## 2019-01-24 DIAGNOSIS — I5032 Chronic diastolic (congestive) heart failure: Secondary | ICD-10-CM | POA: Diagnosis not present

## 2019-03-01 DIAGNOSIS — N179 Acute kidney failure, unspecified: Secondary | ICD-10-CM

## 2019-03-01 DIAGNOSIS — E11649 Type 2 diabetes mellitus with hypoglycemia without coma: Secondary | ICD-10-CM

## 2019-03-02 DIAGNOSIS — N179 Acute kidney failure, unspecified: Secondary | ICD-10-CM | POA: Diagnosis not present

## 2019-03-02 DIAGNOSIS — E11649 Type 2 diabetes mellitus with hypoglycemia without coma: Secondary | ICD-10-CM | POA: Diagnosis not present

## 2019-03-03 DIAGNOSIS — N179 Acute kidney failure, unspecified: Secondary | ICD-10-CM | POA: Diagnosis not present

## 2019-03-03 DIAGNOSIS — E11649 Type 2 diabetes mellitus with hypoglycemia without coma: Secondary | ICD-10-CM | POA: Diagnosis not present

## 2019-03-04 DIAGNOSIS — N179 Acute kidney failure, unspecified: Secondary | ICD-10-CM | POA: Diagnosis not present

## 2019-03-04 DIAGNOSIS — E11649 Type 2 diabetes mellitus with hypoglycemia without coma: Secondary | ICD-10-CM | POA: Diagnosis not present

## 2019-03-05 DIAGNOSIS — N179 Acute kidney failure, unspecified: Secondary | ICD-10-CM | POA: Diagnosis not present

## 2019-03-05 DIAGNOSIS — E11649 Type 2 diabetes mellitus with hypoglycemia without coma: Secondary | ICD-10-CM | POA: Diagnosis not present

## 2019-03-06 DIAGNOSIS — N179 Acute kidney failure, unspecified: Secondary | ICD-10-CM | POA: Diagnosis not present

## 2019-03-06 DIAGNOSIS — E11649 Type 2 diabetes mellitus with hypoglycemia without coma: Secondary | ICD-10-CM | POA: Diagnosis not present

## 2019-03-07 DIAGNOSIS — N179 Acute kidney failure, unspecified: Secondary | ICD-10-CM | POA: Diagnosis not present

## 2019-03-07 DIAGNOSIS — E11649 Type 2 diabetes mellitus with hypoglycemia without coma: Secondary | ICD-10-CM | POA: Diagnosis not present
